# Patient Record
Sex: Male | Born: 1940 | Race: White | Hispanic: No | Marital: Married | Smoking: Never smoker
Health system: Southern US, Community
[De-identification: ages and names within clinical notes are randomized; demographics above are authoritative.]

## PROBLEM LIST (undated history)

## (undated) DIAGNOSIS — Z87442 Personal history of urinary calculi: Secondary | ICD-10-CM

## (undated) DIAGNOSIS — N289 Disorder of kidney and ureter, unspecified: Secondary | ICD-10-CM

## (undated) DIAGNOSIS — H409 Unspecified glaucoma: Secondary | ICD-10-CM

## (undated) DIAGNOSIS — I10 Essential (primary) hypertension: Secondary | ICD-10-CM

## (undated) DIAGNOSIS — E7800 Pure hypercholesterolemia, unspecified: Secondary | ICD-10-CM

## (undated) HISTORY — PX: HERNIA REPAIR: SHX51

---

## 1999-11-22 ENCOUNTER — Encounter: Payer: Self-pay | Admitting: *Deleted

## 1999-11-22 ENCOUNTER — Encounter: Admission: RE | Admit: 1999-11-22 | Discharge: 1999-11-22 | Payer: Self-pay | Admitting: *Deleted

## 2001-08-10 ENCOUNTER — Ambulatory Visit (HOSPITAL_COMMUNITY): Admission: RE | Admit: 2001-08-10 | Discharge: 2001-08-10 | Payer: Self-pay | Admitting: Gastroenterology

## 2001-12-13 ENCOUNTER — Encounter: Admission: RE | Admit: 2001-12-13 | Discharge: 2001-12-13 | Payer: Self-pay | Admitting: Internal Medicine

## 2001-12-13 ENCOUNTER — Encounter: Payer: Self-pay | Admitting: Internal Medicine

## 2002-12-03 ENCOUNTER — Encounter: Admission: RE | Admit: 2002-12-03 | Discharge: 2002-12-03 | Payer: Self-pay | Admitting: Internal Medicine

## 2002-12-03 ENCOUNTER — Encounter: Payer: Self-pay | Admitting: Internal Medicine

## 2005-05-19 ENCOUNTER — Encounter: Admission: RE | Admit: 2005-05-19 | Discharge: 2005-05-19 | Payer: Self-pay | Admitting: Internal Medicine

## 2011-08-29 DIAGNOSIS — E785 Hyperlipidemia, unspecified: Secondary | ICD-10-CM | POA: Diagnosis not present

## 2011-08-29 DIAGNOSIS — Z125 Encounter for screening for malignant neoplasm of prostate: Secondary | ICD-10-CM | POA: Diagnosis not present

## 2011-09-05 DIAGNOSIS — E785 Hyperlipidemia, unspecified: Secondary | ICD-10-CM | POA: Diagnosis not present

## 2011-09-05 DIAGNOSIS — Z125 Encounter for screening for malignant neoplasm of prostate: Secondary | ICD-10-CM | POA: Diagnosis not present

## 2011-09-05 DIAGNOSIS — R413 Other amnesia: Secondary | ICD-10-CM | POA: Diagnosis not present

## 2011-09-05 DIAGNOSIS — Z1211 Encounter for screening for malignant neoplasm of colon: Secondary | ICD-10-CM | POA: Diagnosis not present

## 2011-12-07 DIAGNOSIS — H35379 Puckering of macula, unspecified eye: Secondary | ICD-10-CM | POA: Diagnosis not present

## 2011-12-07 DIAGNOSIS — H52209 Unspecified astigmatism, unspecified eye: Secondary | ICD-10-CM | POA: Diagnosis not present

## 2011-12-07 DIAGNOSIS — H5310 Unspecified subjective visual disturbances: Secondary | ICD-10-CM | POA: Diagnosis not present

## 2011-12-14 DIAGNOSIS — L821 Other seborrheic keratosis: Secondary | ICD-10-CM | POA: Diagnosis not present

## 2011-12-14 DIAGNOSIS — L570 Actinic keratosis: Secondary | ICD-10-CM | POA: Diagnosis not present

## 2011-12-14 DIAGNOSIS — M795 Residual foreign body in soft tissue: Secondary | ICD-10-CM | POA: Diagnosis not present

## 2011-12-14 DIAGNOSIS — D235 Other benign neoplasm of skin of trunk: Secondary | ICD-10-CM | POA: Diagnosis not present

## 2011-12-15 ENCOUNTER — Ambulatory Visit (HOSPITAL_COMMUNITY)
Admission: RE | Admit: 2011-12-15 | Discharge: 2011-12-15 | Disposition: A | Discharge status: Home / Self Care | Payer: Medicare Other | Source: Ambulatory Visit | Attending: Ophthalmology | Admitting: Ophthalmology

## 2011-12-15 DIAGNOSIS — H3560 Retinal hemorrhage, unspecified eye: Secondary | ICD-10-CM | POA: Diagnosis not present

## 2011-12-15 DIAGNOSIS — H5310 Unspecified subjective visual disturbances: Secondary | ICD-10-CM | POA: Diagnosis not present

## 2011-12-15 DIAGNOSIS — H348192 Central retinal vein occlusion, unspecified eye, stable: Secondary | ICD-10-CM | POA: Diagnosis not present

## 2011-12-15 DIAGNOSIS — H349 Unspecified retinal vascular occlusion: Secondary | ICD-10-CM | POA: Diagnosis not present

## 2011-12-15 NOTE — Progress Notes (Signed)
VASCULAR LAB PRELIMINARY  PRELIMINARY  PRELIMINARY  PRELIMINARY  Carotid duplex  completed.    Preliminary report:  Bilateral:  Mild soft plaque in the proximal ICA.  No evidence of hemodynamically significant internal carotid artery stenosis.   Vertebral artery flow is antegrade.      Cabell Lazenby, RVT 12/15/2011, 11:07 AM

## 2012-01-12 DIAGNOSIS — H348192 Central retinal vein occlusion, unspecified eye, stable: Secondary | ICD-10-CM | POA: Diagnosis not present

## 2012-02-14 DIAGNOSIS — H348192 Central retinal vein occlusion, unspecified eye, stable: Secondary | ICD-10-CM | POA: Diagnosis not present

## 2012-04-03 DIAGNOSIS — Z23 Encounter for immunization: Secondary | ICD-10-CM | POA: Diagnosis not present

## 2012-04-12 DIAGNOSIS — H348192 Central retinal vein occlusion, unspecified eye, stable: Secondary | ICD-10-CM | POA: Diagnosis not present

## 2012-04-12 DIAGNOSIS — H35359 Cystoid macular degeneration, unspecified eye: Secondary | ICD-10-CM | POA: Diagnosis not present

## 2012-04-26 DIAGNOSIS — H35359 Cystoid macular degeneration, unspecified eye: Secondary | ICD-10-CM | POA: Diagnosis not present

## 2012-04-26 DIAGNOSIS — H348192 Central retinal vein occlusion, unspecified eye, stable: Secondary | ICD-10-CM | POA: Diagnosis not present

## 2012-05-17 DIAGNOSIS — H348192 Central retinal vein occlusion, unspecified eye, stable: Secondary | ICD-10-CM | POA: Diagnosis not present

## 2012-05-17 DIAGNOSIS — H35359 Cystoid macular degeneration, unspecified eye: Secondary | ICD-10-CM | POA: Diagnosis not present

## 2012-09-04 DIAGNOSIS — Z0000 Encounter for general adult medical examination without abnormal findings: Secondary | ICD-10-CM | POA: Diagnosis not present

## 2012-09-04 DIAGNOSIS — E785 Hyperlipidemia, unspecified: Secondary | ICD-10-CM | POA: Diagnosis not present

## 2012-09-04 DIAGNOSIS — Z125 Encounter for screening for malignant neoplasm of prostate: Secondary | ICD-10-CM | POA: Diagnosis not present

## 2012-09-06 DIAGNOSIS — H35359 Cystoid macular degeneration, unspecified eye: Secondary | ICD-10-CM | POA: Diagnosis not present

## 2012-09-06 DIAGNOSIS — H348192 Central retinal vein occlusion, unspecified eye, stable: Secondary | ICD-10-CM | POA: Diagnosis not present

## 2012-09-11 DIAGNOSIS — E785 Hyperlipidemia, unspecified: Secondary | ICD-10-CM | POA: Diagnosis not present

## 2012-09-11 DIAGNOSIS — Z125 Encounter for screening for malignant neoplasm of prostate: Secondary | ICD-10-CM | POA: Diagnosis not present

## 2012-09-11 DIAGNOSIS — R413 Other amnesia: Secondary | ICD-10-CM | POA: Diagnosis not present

## 2012-10-23 DIAGNOSIS — H348192 Central retinal vein occlusion, unspecified eye, stable: Secondary | ICD-10-CM | POA: Diagnosis not present

## 2012-10-23 DIAGNOSIS — H35359 Cystoid macular degeneration, unspecified eye: Secondary | ICD-10-CM | POA: Diagnosis not present

## 2012-12-25 DIAGNOSIS — H348192 Central retinal vein occlusion, unspecified eye, stable: Secondary | ICD-10-CM | POA: Diagnosis not present

## 2012-12-25 DIAGNOSIS — H35359 Cystoid macular degeneration, unspecified eye: Secondary | ICD-10-CM | POA: Diagnosis not present

## 2013-02-05 DIAGNOSIS — H348192 Central retinal vein occlusion, unspecified eye, stable: Secondary | ICD-10-CM | POA: Diagnosis not present

## 2013-02-05 DIAGNOSIS — H35359 Cystoid macular degeneration, unspecified eye: Secondary | ICD-10-CM | POA: Diagnosis not present

## 2013-02-08 DIAGNOSIS — Z23 Encounter for immunization: Secondary | ICD-10-CM | POA: Diagnosis not present

## 2013-02-18 DIAGNOSIS — B029 Zoster without complications: Secondary | ICD-10-CM | POA: Diagnosis not present

## 2013-03-05 DIAGNOSIS — H348192 Central retinal vein occlusion, unspecified eye, stable: Secondary | ICD-10-CM | POA: Diagnosis not present

## 2013-03-05 DIAGNOSIS — H35359 Cystoid macular degeneration, unspecified eye: Secondary | ICD-10-CM | POA: Diagnosis not present

## 2013-04-09 DIAGNOSIS — H348192 Central retinal vein occlusion, unspecified eye, stable: Secondary | ICD-10-CM | POA: Diagnosis not present

## 2013-04-09 DIAGNOSIS — H35359 Cystoid macular degeneration, unspecified eye: Secondary | ICD-10-CM | POA: Diagnosis not present

## 2013-04-12 DIAGNOSIS — M1710 Unilateral primary osteoarthritis, unspecified knee: Secondary | ICD-10-CM | POA: Diagnosis not present

## 2013-05-01 DIAGNOSIS — H35379 Puckering of macula, unspecified eye: Secondary | ICD-10-CM | POA: Diagnosis not present

## 2013-05-01 DIAGNOSIS — H52209 Unspecified astigmatism, unspecified eye: Secondary | ICD-10-CM | POA: Diagnosis not present

## 2013-05-01 DIAGNOSIS — H348192 Central retinal vein occlusion, unspecified eye, stable: Secondary | ICD-10-CM | POA: Diagnosis not present

## 2013-05-01 DIAGNOSIS — H2510 Age-related nuclear cataract, unspecified eye: Secondary | ICD-10-CM | POA: Diagnosis not present

## 2013-05-21 DIAGNOSIS — H35359 Cystoid macular degeneration, unspecified eye: Secondary | ICD-10-CM | POA: Diagnosis not present

## 2013-05-21 DIAGNOSIS — H348192 Central retinal vein occlusion, unspecified eye, stable: Secondary | ICD-10-CM | POA: Diagnosis not present

## 2013-06-03 DIAGNOSIS — H47099 Other disorders of optic nerve, not elsewhere classified, unspecified eye: Secondary | ICD-10-CM | POA: Diagnosis not present

## 2013-06-04 DIAGNOSIS — H35359 Cystoid macular degeneration, unspecified eye: Secondary | ICD-10-CM | POA: Diagnosis not present

## 2013-06-04 DIAGNOSIS — H348192 Central retinal vein occlusion, unspecified eye, stable: Secondary | ICD-10-CM | POA: Diagnosis not present

## 2013-07-16 DIAGNOSIS — H35359 Cystoid macular degeneration, unspecified eye: Secondary | ICD-10-CM | POA: Diagnosis not present

## 2013-07-16 DIAGNOSIS — H348192 Central retinal vein occlusion, unspecified eye, stable: Secondary | ICD-10-CM | POA: Diagnosis not present

## 2013-08-29 DIAGNOSIS — H348192 Central retinal vein occlusion, unspecified eye, stable: Secondary | ICD-10-CM | POA: Diagnosis not present

## 2013-08-29 DIAGNOSIS — H35359 Cystoid macular degeneration, unspecified eye: Secondary | ICD-10-CM | POA: Diagnosis not present

## 2013-09-11 DIAGNOSIS — Z125 Encounter for screening for malignant neoplasm of prostate: Secondary | ICD-10-CM | POA: Diagnosis not present

## 2013-09-11 DIAGNOSIS — Z1331 Encounter for screening for depression: Secondary | ICD-10-CM | POA: Diagnosis not present

## 2013-09-11 DIAGNOSIS — Z0000 Encounter for general adult medical examination without abnormal findings: Secondary | ICD-10-CM | POA: Diagnosis not present

## 2013-09-11 DIAGNOSIS — E785 Hyperlipidemia, unspecified: Secondary | ICD-10-CM | POA: Diagnosis not present

## 2013-09-18 DIAGNOSIS — N400 Enlarged prostate without lower urinary tract symptoms: Secondary | ICD-10-CM | POA: Diagnosis not present

## 2013-09-18 DIAGNOSIS — Z125 Encounter for screening for malignant neoplasm of prostate: Secondary | ICD-10-CM | POA: Diagnosis not present

## 2013-09-18 DIAGNOSIS — N182 Chronic kidney disease, stage 2 (mild): Secondary | ICD-10-CM | POA: Diagnosis not present

## 2013-09-18 DIAGNOSIS — E785 Hyperlipidemia, unspecified: Secondary | ICD-10-CM | POA: Diagnosis not present

## 2013-10-01 DIAGNOSIS — H348192 Central retinal vein occlusion, unspecified eye, stable: Secondary | ICD-10-CM | POA: Diagnosis not present

## 2013-11-12 DIAGNOSIS — H348192 Central retinal vein occlusion, unspecified eye, stable: Secondary | ICD-10-CM | POA: Diagnosis not present

## 2014-01-02 DIAGNOSIS — H348192 Central retinal vein occlusion, unspecified eye, stable: Secondary | ICD-10-CM | POA: Diagnosis not present

## 2014-02-18 DIAGNOSIS — H348192 Central retinal vein occlusion, unspecified eye, stable: Secondary | ICD-10-CM | POA: Diagnosis not present

## 2014-02-18 DIAGNOSIS — H35359 Cystoid macular degeneration, unspecified eye: Secondary | ICD-10-CM | POA: Diagnosis not present

## 2014-02-28 DIAGNOSIS — Z23 Encounter for immunization: Secondary | ICD-10-CM | POA: Diagnosis not present

## 2014-04-15 DIAGNOSIS — H34812 Central retinal vein occlusion, left eye: Secondary | ICD-10-CM | POA: Diagnosis not present

## 2014-04-15 DIAGNOSIS — H35352 Cystoid macular degeneration, left eye: Secondary | ICD-10-CM | POA: Diagnosis not present

## 2014-06-11 DIAGNOSIS — H35372 Puckering of macula, left eye: Secondary | ICD-10-CM | POA: Diagnosis not present

## 2014-06-11 DIAGNOSIS — H5213 Myopia, bilateral: Secondary | ICD-10-CM | POA: Diagnosis not present

## 2014-06-12 DIAGNOSIS — H34812 Central retinal vein occlusion, left eye: Secondary | ICD-10-CM | POA: Diagnosis not present

## 2014-06-12 DIAGNOSIS — H35352 Cystoid macular degeneration, left eye: Secondary | ICD-10-CM | POA: Diagnosis not present

## 2014-07-24 DIAGNOSIS — H35352 Cystoid macular degeneration, left eye: Secondary | ICD-10-CM | POA: Diagnosis not present

## 2014-07-24 DIAGNOSIS — H34812 Central retinal vein occlusion, left eye: Secondary | ICD-10-CM | POA: Diagnosis not present

## 2014-09-17 DIAGNOSIS — E785 Hyperlipidemia, unspecified: Secondary | ICD-10-CM | POA: Diagnosis not present

## 2014-09-17 DIAGNOSIS — Z0000 Encounter for general adult medical examination without abnormal findings: Secondary | ICD-10-CM | POA: Diagnosis not present

## 2014-09-17 DIAGNOSIS — M8580 Other specified disorders of bone density and structure, unspecified site: Secondary | ICD-10-CM | POA: Diagnosis not present

## 2014-09-17 DIAGNOSIS — Z125 Encounter for screening for malignant neoplasm of prostate: Secondary | ICD-10-CM | POA: Diagnosis not present

## 2014-09-17 DIAGNOSIS — Z1389 Encounter for screening for other disorder: Secondary | ICD-10-CM | POA: Diagnosis not present

## 2014-09-18 DIAGNOSIS — H34812 Central retinal vein occlusion, left eye: Secondary | ICD-10-CM | POA: Diagnosis not present

## 2014-09-24 DIAGNOSIS — E785 Hyperlipidemia, unspecified: Secondary | ICD-10-CM | POA: Diagnosis not present

## 2014-09-24 DIAGNOSIS — N400 Enlarged prostate without lower urinary tract symptoms: Secondary | ICD-10-CM | POA: Diagnosis not present

## 2014-09-24 DIAGNOSIS — N182 Chronic kidney disease, stage 2 (mild): Secondary | ICD-10-CM | POA: Diagnosis not present

## 2014-09-24 DIAGNOSIS — M8580 Other specified disorders of bone density and structure, unspecified site: Secondary | ICD-10-CM | POA: Diagnosis not present

## 2014-11-13 DIAGNOSIS — H35373 Puckering of macula, bilateral: Secondary | ICD-10-CM | POA: Diagnosis not present

## 2014-11-13 DIAGNOSIS — H34812 Central retinal vein occlusion, left eye: Secondary | ICD-10-CM | POA: Diagnosis not present

## 2014-11-13 DIAGNOSIS — H43813 Vitreous degeneration, bilateral: Secondary | ICD-10-CM | POA: Diagnosis not present

## 2015-01-13 DIAGNOSIS — H34812 Central retinal vein occlusion, left eye: Secondary | ICD-10-CM | POA: Diagnosis not present

## 2015-02-26 DIAGNOSIS — Z23 Encounter for immunization: Secondary | ICD-10-CM | POA: Diagnosis not present

## 2015-03-31 DIAGNOSIS — H348122 Central retinal vein occlusion, left eye, stable: Secondary | ICD-10-CM | POA: Diagnosis not present

## 2015-06-15 DIAGNOSIS — H2513 Age-related nuclear cataract, bilateral: Secondary | ICD-10-CM | POA: Diagnosis not present

## 2015-06-15 DIAGNOSIS — H5213 Myopia, bilateral: Secondary | ICD-10-CM | POA: Diagnosis not present

## 2015-06-23 DIAGNOSIS — H43813 Vitreous degeneration, bilateral: Secondary | ICD-10-CM | POA: Diagnosis not present

## 2015-06-23 DIAGNOSIS — H348122 Central retinal vein occlusion, left eye, stable: Secondary | ICD-10-CM | POA: Diagnosis not present

## 2015-06-23 DIAGNOSIS — H35373 Puckering of macula, bilateral: Secondary | ICD-10-CM | POA: Diagnosis not present

## 2015-06-23 DIAGNOSIS — H35341 Macular cyst, hole, or pseudohole, right eye: Secondary | ICD-10-CM | POA: Diagnosis not present

## 2015-09-22 DIAGNOSIS — Z0000 Encounter for general adult medical examination without abnormal findings: Secondary | ICD-10-CM | POA: Diagnosis not present

## 2015-09-22 DIAGNOSIS — Z23 Encounter for immunization: Secondary | ICD-10-CM | POA: Diagnosis not present

## 2015-09-22 DIAGNOSIS — E559 Vitamin D deficiency, unspecified: Secondary | ICD-10-CM | POA: Diagnosis not present

## 2015-09-22 DIAGNOSIS — Z1389 Encounter for screening for other disorder: Secondary | ICD-10-CM | POA: Diagnosis not present

## 2015-09-22 DIAGNOSIS — E785 Hyperlipidemia, unspecified: Secondary | ICD-10-CM | POA: Diagnosis not present

## 2015-09-22 DIAGNOSIS — Z125 Encounter for screening for malignant neoplasm of prostate: Secondary | ICD-10-CM | POA: Diagnosis not present

## 2015-09-22 DIAGNOSIS — M8580 Other specified disorders of bone density and structure, unspecified site: Secondary | ICD-10-CM | POA: Diagnosis not present

## 2015-09-29 DIAGNOSIS — N182 Chronic kidney disease, stage 2 (mild): Secondary | ICD-10-CM | POA: Diagnosis not present

## 2015-09-29 DIAGNOSIS — N400 Enlarged prostate without lower urinary tract symptoms: Secondary | ICD-10-CM | POA: Diagnosis not present

## 2015-09-29 DIAGNOSIS — E785 Hyperlipidemia, unspecified: Secondary | ICD-10-CM | POA: Diagnosis not present

## 2015-09-29 DIAGNOSIS — M8580 Other specified disorders of bone density and structure, unspecified site: Secondary | ICD-10-CM | POA: Diagnosis not present

## 2015-10-01 DIAGNOSIS — H34812 Central retinal vein occlusion, left eye, with macular edema: Secondary | ICD-10-CM | POA: Diagnosis not present

## 2015-10-01 DIAGNOSIS — H35372 Puckering of macula, left eye: Secondary | ICD-10-CM | POA: Diagnosis not present

## 2016-01-12 DIAGNOSIS — H348122 Central retinal vein occlusion, left eye, stable: Secondary | ICD-10-CM | POA: Diagnosis not present

## 2016-03-04 DIAGNOSIS — Z23 Encounter for immunization: Secondary | ICD-10-CM | POA: Diagnosis not present

## 2016-04-19 DIAGNOSIS — H348122 Central retinal vein occlusion, left eye, stable: Secondary | ICD-10-CM | POA: Diagnosis not present

## 2016-04-19 DIAGNOSIS — H35373 Puckering of macula, bilateral: Secondary | ICD-10-CM | POA: Diagnosis not present

## 2016-06-21 DIAGNOSIS — H2513 Age-related nuclear cataract, bilateral: Secondary | ICD-10-CM | POA: Diagnosis not present

## 2016-06-21 DIAGNOSIS — H5213 Myopia, bilateral: Secondary | ICD-10-CM | POA: Diagnosis not present

## 2016-06-21 DIAGNOSIS — H35373 Puckering of macula, bilateral: Secondary | ICD-10-CM | POA: Diagnosis not present

## 2016-08-17 DIAGNOSIS — H348122 Central retinal vein occlusion, left eye, stable: Secondary | ICD-10-CM | POA: Diagnosis not present

## 2016-08-17 DIAGNOSIS — H35373 Puckering of macula, bilateral: Secondary | ICD-10-CM | POA: Diagnosis not present

## 2016-09-12 DIAGNOSIS — H0012 Chalazion right lower eyelid: Secondary | ICD-10-CM | POA: Diagnosis not present

## 2016-09-22 DIAGNOSIS — Z0000 Encounter for general adult medical examination without abnormal findings: Secondary | ICD-10-CM | POA: Diagnosis not present

## 2016-09-22 DIAGNOSIS — E785 Hyperlipidemia, unspecified: Secondary | ICD-10-CM | POA: Diagnosis not present

## 2016-09-22 DIAGNOSIS — Z125 Encounter for screening for malignant neoplasm of prostate: Secondary | ICD-10-CM | POA: Diagnosis not present

## 2016-09-22 DIAGNOSIS — M8580 Other specified disorders of bone density and structure, unspecified site: Secondary | ICD-10-CM | POA: Diagnosis not present

## 2016-09-29 DIAGNOSIS — N400 Enlarged prostate without lower urinary tract symptoms: Secondary | ICD-10-CM | POA: Diagnosis not present

## 2016-09-29 DIAGNOSIS — M8580 Other specified disorders of bone density and structure, unspecified site: Secondary | ICD-10-CM | POA: Diagnosis not present

## 2016-09-29 DIAGNOSIS — H6123 Impacted cerumen, bilateral: Secondary | ICD-10-CM | POA: Diagnosis not present

## 2016-12-22 DIAGNOSIS — H348122 Central retinal vein occlusion, left eye, stable: Secondary | ICD-10-CM | POA: Diagnosis not present

## 2016-12-22 DIAGNOSIS — H35373 Puckering of macula, bilateral: Secondary | ICD-10-CM | POA: Diagnosis not present

## 2017-03-23 ENCOUNTER — Emergency Department (HOSPITAL_COMMUNITY): Payer: Medicare Other

## 2017-03-23 ENCOUNTER — Encounter (HOSPITAL_COMMUNITY): Payer: Self-pay | Admitting: Obstetrics and Gynecology

## 2017-03-23 ENCOUNTER — Emergency Department (HOSPITAL_COMMUNITY)
Admission: EM | Admit: 2017-03-23 | Discharge: 2017-03-23 | Disposition: A | Discharge status: Home / Self Care | Payer: Medicare Other | Attending: Emergency Medicine | Admitting: Emergency Medicine

## 2017-03-23 DIAGNOSIS — W11XXXA Fall on and from ladder, initial encounter: Secondary | ICD-10-CM | POA: Insufficient documentation

## 2017-03-23 DIAGNOSIS — Y939 Activity, unspecified: Secondary | ICD-10-CM | POA: Insufficient documentation

## 2017-03-23 DIAGNOSIS — S93401A Sprain of unspecified ligament of right ankle, initial encounter: Secondary | ICD-10-CM | POA: Insufficient documentation

## 2017-03-23 DIAGNOSIS — Y92009 Unspecified place in unspecified non-institutional (private) residence as the place of occurrence of the external cause: Secondary | ICD-10-CM | POA: Diagnosis not present

## 2017-03-23 DIAGNOSIS — Y998 Other external cause status: Secondary | ICD-10-CM | POA: Diagnosis not present

## 2017-03-23 DIAGNOSIS — S99911A Unspecified injury of right ankle, initial encounter: Secondary | ICD-10-CM | POA: Diagnosis present

## 2017-03-23 DIAGNOSIS — M25571 Pain in right ankle and joints of right foot: Secondary | ICD-10-CM | POA: Diagnosis not present

## 2017-03-23 NOTE — Discharge Instructions (Signed)
Keep leg elevated as much as possible.  Ice 2-3 times per day.  Follow up with your doctor in one week for a recheck.

## 2017-03-23 NOTE — ED Triage Notes (Signed)
Pt was cleaning the gutters and fell off a ladder landing on his right ankle

## 2017-03-23 NOTE — ED Provider Notes (Signed)
Pt awaiting x-ray.  Ankle x-ray shows no acute injury, small chip off lateral malleolus.  Pt has mild swelling to both malleoli, advised pt this could be acute vs old, likely from sprain.  Placed in ASO.  Denies need for pain meds.  Advised to f/u with PCP in one week for recheck.  Advised in ice and elevation.   Jordan Johns, MD 03/23/17 [phone]

## 2017-03-23 NOTE — ED Provider Notes (Signed)
[facility] DEPT Provider Note   CSN: [id number] Arrival date & time: 03/23/17  1340     History   Chief Complaint Chief Complaint  Patient presents with  . Ankle Pain    HPI Jordan Lang is a 76 y.o. male.  76 year old male presents with right ankle pain/injury. He reports that he fell off a ladder while repairing his gutters. He landed hard on the right ankle. No other reported injury. He declines medication for pain at this time. He is able to ambulate (albeit with a limp). He showered and changed prior to coming to the ED for evaluation.    The history is provided by the patient.  Ankle Pain   The incident occurred 3 to 5 hours ago. The incident occurred at home. The injury mechanism was a fall. The pain is present in the right ankle. The quality of the pain is described as aching. The pain is mild. The pain has been constant since onset. Pertinent negatives include no inability to bear weight. He reports no foreign bodies present. The symptoms are aggravated by bearing weight.    No past medical history on file.  There are no active problems to display for this patient.   No past surgical history on file.     Home Medications    Prior to Admission medications   Not on File    Family History No family history on file.  Social History Social History  Substance Use Topics  . Smoking status: Not on file  . Smokeless tobacco: Not on file  . Alcohol use Yes     Comment: Social     Allergies   Patient has no allergy information on record.   Review of Systems Review of Systems  Musculoskeletal:       Right ankle pain  All other systems reviewed and are negative.    Physical Exam Updated Vital Signs BP (!) 145/78 (BP Location: Right Arm)   Pulse 62   Temp 97.7 F (36.5 C) (Oral)   Resp 16   Ht 5\' 8"  (1.727 m)   Wt 63.5 kg (140 lb)   SpO2 98%   BMI 21.29 kg/m   Physical Exam  Constitutional: He appears  well-developed and well-nourished.  HENT:  Head: Normocephalic and atraumatic.  Mouth/Throat: Oropharynx is clear and moist.  Eyes: Pupils are equal, round, and reactive to light. Conjunctivae and EOM are normal.  Neck: Normal range of motion. Neck supple.  Cardiovascular: Normal rate and regular rhythm.   No murmur heard. Pulmonary/Chest: Effort normal and breath sounds normal. No respiratory distress.  Abdominal: Soft. He exhibits no distension. There is no tenderness.  Musculoskeletal: Normal range of motion. He exhibits no edema.  Mild edema of right ankle - tender to medial and lateral malleolus - Full AROM - Ambulatory and weight bearing - No break in skin - Distal RLE is NVI  Neurological: He is alert.  Skin: Skin is warm and dry.  Psychiatric: He has a normal mood and affect.  Nursing note and vitals reviewed.    ED Treatments / Results  Labs (all labs ordered are listed, but only abnormal results are displayed) Labs Reviewed - No data to display  EKG  EKG Interpretation None       Radiology No results found.  Procedures Procedures (including critical care time)  Medications Ordered in ED Medications - No data to display   Initial Impression / Assessment and Plan / ED  Course  I have reviewed the triage vital signs and the nursing notes.  Pertinent labs & imaging results that were available during my care of the patient were reviewed by me and considered in my medical decision making (see chart for details).     MSE - Screen complete  Right ankle injury - Fx vs Sprain. Will obtain Xray and manage appropriately.   1548 - XR and Dispo pending - Dr. Tamera Punt aware of case.   Final Clinical Impressions(s) / ED Diagnoses   Final diagnoses:  Injury of right ankle, initial encounter    New Prescriptions New Prescriptions   No medications on file     Valarie Merino, MD 03/23/17 1549

## 2017-03-28 DIAGNOSIS — S93401D Sprain of unspecified ligament of right ankle, subsequent encounter: Secondary | ICD-10-CM | POA: Diagnosis not present

## 2017-03-28 DIAGNOSIS — Z23 Encounter for immunization: Secondary | ICD-10-CM | POA: Diagnosis not present

## 2017-05-18 DIAGNOSIS — H43813 Vitreous degeneration, bilateral: Secondary | ICD-10-CM | POA: Diagnosis not present

## 2017-05-18 DIAGNOSIS — H348122 Central retinal vein occlusion, left eye, stable: Secondary | ICD-10-CM | POA: Diagnosis not present

## 2017-05-18 DIAGNOSIS — H35373 Puckering of macula, bilateral: Secondary | ICD-10-CM | POA: Diagnosis not present

## 2017-09-28 DIAGNOSIS — M8580 Other specified disorders of bone density and structure, unspecified site: Secondary | ICD-10-CM | POA: Diagnosis not present

## 2017-09-28 DIAGNOSIS — E785 Hyperlipidemia, unspecified: Secondary | ICD-10-CM | POA: Diagnosis not present

## 2017-09-28 DIAGNOSIS — E559 Vitamin D deficiency, unspecified: Secondary | ICD-10-CM | POA: Diagnosis not present

## 2017-09-28 DIAGNOSIS — N182 Chronic kidney disease, stage 2 (mild): Secondary | ICD-10-CM | POA: Diagnosis not present

## 2017-10-05 DIAGNOSIS — E785 Hyperlipidemia, unspecified: Secondary | ICD-10-CM | POA: Diagnosis not present

## 2017-10-05 DIAGNOSIS — Z0000 Encounter for general adult medical examination without abnormal findings: Secondary | ICD-10-CM | POA: Diagnosis not present

## 2017-10-05 DIAGNOSIS — H6120 Impacted cerumen, unspecified ear: Secondary | ICD-10-CM | POA: Diagnosis not present

## 2017-11-09 DIAGNOSIS — H43813 Vitreous degeneration, bilateral: Secondary | ICD-10-CM | POA: Diagnosis not present

## 2017-11-09 DIAGNOSIS — H348122 Central retinal vein occlusion, left eye, stable: Secondary | ICD-10-CM | POA: Diagnosis not present

## 2017-11-09 DIAGNOSIS — H35373 Puckering of macula, bilateral: Secondary | ICD-10-CM | POA: Diagnosis not present

## 2018-01-29 DIAGNOSIS — H1789 Other corneal scars and opacities: Secondary | ICD-10-CM | POA: Diagnosis not present

## 2018-01-29 DIAGNOSIS — H2513 Age-related nuclear cataract, bilateral: Secondary | ICD-10-CM | POA: Diagnosis not present

## 2018-01-29 DIAGNOSIS — H5213 Myopia, bilateral: Secondary | ICD-10-CM | POA: Diagnosis not present

## 2018-01-29 DIAGNOSIS — H35373 Puckering of macula, bilateral: Secondary | ICD-10-CM | POA: Diagnosis not present

## 2018-02-09 DIAGNOSIS — Z23 Encounter for immunization: Secondary | ICD-10-CM | POA: Diagnosis not present

## 2018-04-18 DIAGNOSIS — H35373 Puckering of macula, bilateral: Secondary | ICD-10-CM | POA: Diagnosis not present

## 2018-04-18 DIAGNOSIS — H348122 Central retinal vein occlusion, left eye, stable: Secondary | ICD-10-CM | POA: Diagnosis not present

## 2018-06-22 DIAGNOSIS — M25521 Pain in right elbow: Secondary | ICD-10-CM | POA: Diagnosis not present

## 2018-07-17 DIAGNOSIS — M25521 Pain in right elbow: Secondary | ICD-10-CM | POA: Diagnosis not present

## 2018-10-04 DIAGNOSIS — E785 Hyperlipidemia, unspecified: Secondary | ICD-10-CM | POA: Diagnosis not present

## 2018-10-04 DIAGNOSIS — Z125 Encounter for screening for malignant neoplasm of prostate: Secondary | ICD-10-CM | POA: Diagnosis not present

## 2018-10-08 DIAGNOSIS — Z0000 Encounter for general adult medical examination without abnormal findings: Secondary | ICD-10-CM | POA: Diagnosis not present

## 2018-10-08 DIAGNOSIS — N400 Enlarged prostate without lower urinary tract symptoms: Secondary | ICD-10-CM | POA: Diagnosis not present

## 2018-10-08 DIAGNOSIS — Z1212 Encounter for screening for malignant neoplasm of rectum: Secondary | ICD-10-CM | POA: Diagnosis not present

## 2018-10-08 DIAGNOSIS — E785 Hyperlipidemia, unspecified: Secondary | ICD-10-CM | POA: Diagnosis not present

## 2018-10-08 DIAGNOSIS — D72819 Decreased white blood cell count, unspecified: Secondary | ICD-10-CM | POA: Diagnosis not present

## 2018-10-08 DIAGNOSIS — Q676 Pectus excavatum: Secondary | ICD-10-CM | POA: Diagnosis not present

## 2018-10-08 DIAGNOSIS — M8580 Other specified disorders of bone density and structure, unspecified site: Secondary | ICD-10-CM | POA: Diagnosis not present

## 2018-10-08 DIAGNOSIS — R0989 Other specified symptoms and signs involving the circulatory and respiratory systems: Secondary | ICD-10-CM | POA: Diagnosis not present

## 2018-10-28 ENCOUNTER — Emergency Department (HOSPITAL_COMMUNITY): Payer: Medicare Other

## 2018-10-28 ENCOUNTER — Emergency Department (HOSPITAL_COMMUNITY)
Admission: EM | Admit: 2018-10-28 | Discharge: 2018-10-29 | Disposition: A | Discharge status: Home / Self Care | Payer: Medicare Other | Attending: Emergency Medicine | Admitting: Emergency Medicine

## 2018-10-28 ENCOUNTER — Other Ambulatory Visit: Payer: Self-pay

## 2018-10-28 ENCOUNTER — Encounter (HOSPITAL_COMMUNITY): Payer: Self-pay

## 2018-10-28 DIAGNOSIS — N132 Hydronephrosis with renal and ureteral calculous obstruction: Secondary | ICD-10-CM | POA: Diagnosis not present

## 2018-10-28 DIAGNOSIS — Z79899 Other long term (current) drug therapy: Secondary | ICD-10-CM | POA: Diagnosis not present

## 2018-10-28 DIAGNOSIS — N200 Calculus of kidney: Secondary | ICD-10-CM | POA: Insufficient documentation

## 2018-10-28 DIAGNOSIS — N201 Calculus of ureter: Secondary | ICD-10-CM

## 2018-10-28 DIAGNOSIS — R110 Nausea: Secondary | ICD-10-CM | POA: Diagnosis not present

## 2018-10-28 DIAGNOSIS — R109 Unspecified abdominal pain: Secondary | ICD-10-CM | POA: Diagnosis not present

## 2018-10-28 HISTORY — DX: Disorder of kidney and ureter, unspecified: N28.9

## 2018-10-28 LAB — CBC
HCT: 40.4 % (ref 39.0–52.0)
Hemoglobin: 13.5 g/dL (ref 13.0–17.0)
MCH: 32.1 pg (ref 26.0–34.0)
MCHC: 33.4 g/dL (ref 30.0–36.0)
MCV: 96.2 fL (ref 80.0–100.0)
Platelets: 198 10*3/uL (ref 150–400)
RBC: 4.2 MIL/uL — ABNORMAL LOW (ref 4.22–5.81)
RDW: 12 % (ref 11.5–15.5)
WBC: 7.1 10*3/uL (ref 4.0–10.5)
nRBC: 0 % (ref 0.0–0.2)

## 2018-10-28 LAB — URINALYSIS, ROUTINE W REFLEX MICROSCOPIC
Bilirubin Urine: NEGATIVE
Glucose, UA: NEGATIVE mg/dL
Ketones, ur: 5 mg/dL — AB
Leukocytes,Ua: NEGATIVE
Nitrite: NEGATIVE
Protein, ur: 100 mg/dL — AB
RBC / HPF: >50 RBC/hpf — ABNORMAL HIGH (ref 0–5)
Specific Gravity, Urine: 1.024 (ref 1.005–1.030)
pH: 6 (ref 5.0–8.0)

## 2018-10-28 LAB — BASIC METABOLIC PANEL
Anion gap: 11 (ref 5–15)
BUN: 22 mg/dL (ref 8–23)
CO2: 22 mmol/L (ref 22–32)
Calcium: 9.6 mg/dL (ref 8.9–10.3)
Chloride: 105 mmol/L (ref 98–111)
Creatinine, Ser: 0.87 mg/dL (ref 0.61–1.24)
GFR calc Af Amer: >60 mL/min (ref >60)
GFR calc non Af Amer: >60 mL/min (ref >60)
Glucose, Bld: 129 mg/dL — ABNORMAL HIGH (ref 70–99)
Potassium: 4.4 mmol/L (ref 3.5–5.1)
Sodium: 138 mmol/L (ref 135–145)

## 2018-10-28 MED ORDER — SODIUM CHLORIDE 0.9 % IV BOLUS
1000.0000 mL | Freq: Once | INTRAVENOUS | Status: AC
Start: 2018-10-28 — End: 2018-10-29
  Administered 2018-10-28: 21:00:00 1000 mL via INTRAVENOUS

## 2018-10-28 MED ORDER — MORPHINE SULFATE (PF) 4 MG/ML IV SOLN
4.0000 mg | Freq: Once | INTRAVENOUS | Status: AC
  Administered 2018-10-28: 4 mg via INTRAVENOUS
  Filled 2018-10-28: qty 1

## 2018-10-28 MED ORDER — TAMSULOSIN HCL 0.4 MG PO CAPS: 0.4000 mg | ORAL_CAPSULE | Freq: Every day | ORAL | 0 refills | Status: DC

## 2018-10-28 MED ORDER — KETOROLAC TROMETHAMINE 30 MG/ML IJ SOLN
30.0000 mg | Freq: Once | INTRAMUSCULAR | Status: AC
  Administered 2018-10-28: 30 mg via INTRAVENOUS
  Filled 2018-10-28: qty 1

## 2018-10-28 MED ORDER — HYDROMORPHONE HCL 1 MG/ML IJ SOLN
0.5000 mg | Freq: Once | INTRAMUSCULAR | Status: AC
  Administered 2018-10-28: 0.5 mg via INTRAVENOUS
  Filled 2018-10-28: qty 1

## 2018-10-28 MED ORDER — OXYCODONE-ACETAMINOPHEN 5-325 MG PO TABS: 1.0000 | ORAL_TABLET | Freq: Four times a day (QID) | ORAL | 0 refills | Status: DC | PRN

## 2018-10-28 NOTE — Discharge Instructions (Signed)
Follow-up with the urologist provided.  Return here for any worsening in your condition.  Increase your fluid intake.

## 2018-10-28 NOTE — ED Triage Notes (Signed)
Pt reports left sided flank pain that started today, hx of kidney stones about 13 years ago, states it feels similar.  Little nausea. Pt a.o, nad noted

## 2018-10-28 NOTE — ED Provider Notes (Signed)
[facility] EMERGENCY DEPARTMENT Provider Note   CSN: [id number] Arrival date & time: 10/28/18  9509    History   Chief Complaint Chief Complaint  Patient presents with   Flank Pain    HPI Jordan Lang is a 78 y.o. male.     HPI Patient presents to the emergency department with left-sided flank pain that started earlier this afternoon.  The patient states that he did have one episode of nausea with this.  He states the pain is fairly constant and is barely in the left flank radiating to the left anterior abdomen.  Patient states he has had a kidney stone in the past and this feels similar.  Patient states he did not take any medications prior to arrival for his symptoms.  The patient denies chest pain, shortness of breath, headache,blurred vision, neck pain, fever, cough, weakness, numbness, dizziness, anorexia, edema, , vomiting, diarrhea, rash, back pain, dysuria, hematemesis, bloody stool, near syncope, or syncope. Past Medical History:  Diagnosis Date   Renal disorder    kidney stones    There are no active problems to display for this patient.   Past Surgical History:  Procedure Laterality Date   HERNIA REPAIR          Home Medications    Prior to Admission medications   Medication Sig Start Date End Date Taking? Authorizing Provider  Cholecalciferol (VITAMIN D3) 2000 units TABS Take 1 tablet by mouth daily.    [provider]  Multiple Vitamins-Minerals (CENTRUM ADULTS PO) Take 1 tablet by mouth.    [provider]    Family History No family history on file.  Social History Social History   Tobacco Use   Smoking status: Not on file  Substance Use Topics   Alcohol use: Yes    Comment: Social   Drug use: Not on file     Allergies   Patient has no allergy information on record.   Review of Systems Review of Systems All other systems negative except as documented in the HPI. All pertinent positives and  negatives as reviewed in the HPI.  Physical Exam Updated Vital Signs BP (!) 163/68    Pulse (!) 47    Temp 98.2 F (36.8 C) (Oral)    Resp 20    Ht 5\' 8"  (1.727 m)    Wt 61.2 kg    SpO2 95%    BMI 20.53 kg/m   Physical Exam Vitals signs and nursing note reviewed.  Constitutional:      General: He is not in acute distress.    Appearance: He is well-developed.  HENT:     Head: Normocephalic and atraumatic.  Eyes:     Pupils: Pupils are equal, round, and reactive to light.  Neck:     Musculoskeletal: Normal range of motion and neck supple.  Cardiovascular:     Rate and Rhythm: Normal rate and regular rhythm.     Heart sounds: Normal heart sounds. No murmur. No friction rub. No gallop.   Pulmonary:     Effort: Pulmonary effort is normal. No respiratory distress.     Breath sounds: Normal breath sounds. No wheezing.  Abdominal:     General: Bowel sounds are normal. There is no distension.     Palpations: Abdomen is soft.     Tenderness: There is no abdominal tenderness.  Skin:    General: Skin is warm and dry.     Capillary Refill: Capillary refill takes less than  2 seconds.     Findings: No erythema or rash.  Neurological:     Mental Status: He is alert and oriented to person, place, and time.     Motor: No abnormal muscle tone.     Coordination: Coordination normal.  Psychiatric:        Behavior: Behavior normal.      ED Treatments / Results  Labs (all labs ordered are listed, but only abnormal results are displayed) Labs Reviewed  CBC - Abnormal; Notable for the following components:      Result Value   RBC 4.20 (*)    All other components within normal limits  BASIC METABOLIC PANEL - Abnormal; Notable for the following components:   Glucose, Bld 129 (*)    All other components within normal limits  URINALYSIS, ROUTINE W REFLEX MICROSCOPIC    EKG None  Radiology Ct Renal Stone Study  Result Date: 10/28/2018 CLINICAL DATA:  78 year old male with history of  left-sided flank pain and nausea. History of kidney stones. EXAM: CT ABDOMEN AND PELVIS WITHOUT CONTRAST TECHNIQUE: Multidetector CT imaging of the abdomen and pelvis was performed following the standard protocol without IV contrast. COMPARISON:  CT the abdomen and pelvis 05/19/2005. FINDINGS: Lower chest: Aortic atherosclerosis. Mild scarring in the lung bases. Hepatobiliary: No definite suspicious cystic or solid hepatic lesions are confidently identified on today's noncontrast CT examination. Unenhanced appearance of the gallbladder is normal. Pancreas: No definite pancreatic mass or peripancreatic fluid collections or inflammatory changes noted on today's noncontrast CT examination. Spleen: Unremarkable. Adrenals/Urinary Tract: 3 tiny 1-2 mm nonobstructive calculi in the lower pole collecting system of the left kidney. In addition, in the proximal third of the left ureter (coronal image 39 of series 6) there is a 3 mm calculus which is associated with mild proximal hydroureteronephrosis. No additional calculi are identified within the right renal collecting system. However, in the distal third of the right ureter at the level of the right ureterovesicular junction (axial image 72 of series 3 and coronal image 50 of series 6) there is a 6 mm calculus. This is not associated with significant proximal right hydroureteronephrosis. In the anterior aspect of the upper pole of the left kidney there is a well-defined 1.8 cm low-attenuation lesion, incompletely characterized on today's noncontrast CT examination, but statistically likely a cyst. Unenhanced appearance of the right kidney and bilateral adrenal glands is otherwise unremarkable. Urinary bladder is normal in appearance. Stomach/Bowel: Unenhanced appearance of the stomach is normal. No pathologic dilatation of small bowel or colon. The appendix is not confidently identified and may be surgically absent. Regardless, there are no inflammatory changes noted  adjacent to the cecum to suggest the presence of an acute appendicitis at this time. Vascular/Lymphatic: Aortic atherosclerosis. No lymphadenopathy noted in the abdomen or pelvis. Reproductive: Prostate gland and seminal vesicles are unremarkable in appearance. Other: No significant volume of ascites.  No pneumoperitoneum. Musculoskeletal: There are no aggressive appearing lytic or blastic lesions noted in the visualized portions of the skeleton. IMPRESSION: 1. 3 mm structure of calculus in the proximal third of the left ureter with mild proximal left hydroureteronephrosis. 2. 6 mm calculus at the right ureterovesicular junction. At this time, there is no proximal right hydroureteronephrosis to indicate obstruction. 3. Three additional tiny 1-2 mm nonobstructive calculi in the lower pole collecting system of left kidney. 4. Aortic atherosclerosis. Electronically Signed   By: Vinnie Langton M.D.   On: 10/28/2018 21:54    Procedures Procedures (including critical care time)  Medications Ordered in ED Medications  sodium chloride 0.9 % bolus 1,000 mL (1,000 mLs Intravenous New Bag/Given 10/28/18 2124)  morphine 4 MG/ML injection 4 mg (4 mg Intravenous Given 10/28/18 2124)     Initial Impression / Assessment and Plan / ED Course  I have reviewed the triage vital signs and the nursing notes.  Pertinent labs & imaging results that were available during my care of the patient were reviewed by me and considered in my medical decision making (see chart for details).        Patient has what appears to be a 3 mm left ureteral stone.  Patient is feeling some improvement with the pain medications.  Will most likely be discharged with pain medication and referral to urology.  Did give the patient a second round of pain medicine here as he was still feeling some significant discomfort.  Patient will be reassessed and given the plan.  Follow-up with urology. Final Clinical Impressions(s) / ED Diagnoses    Final diagnoses:  None    ED Discharge Orders    None       Rebeca Allegra 10/28/18 2323    Quintella Reichert, MD 11/01/18 1058

## 2018-10-29 DIAGNOSIS — N201 Calculus of ureter: Secondary | ICD-10-CM | POA: Diagnosis not present

## 2018-11-02 DIAGNOSIS — M8589 Other specified disorders of bone density and structure, multiple sites: Secondary | ICD-10-CM | POA: Diagnosis not present

## 2018-11-08 DIAGNOSIS — M8580 Other specified disorders of bone density and structure, unspecified site: Secondary | ICD-10-CM | POA: Diagnosis not present

## 2018-11-08 DIAGNOSIS — R030 Elevated blood-pressure reading, without diagnosis of hypertension: Secondary | ICD-10-CM | POA: Diagnosis not present

## 2018-11-12 DIAGNOSIS — H35341 Macular cyst, hole, or pseudohole, right eye: Secondary | ICD-10-CM | POA: Diagnosis not present

## 2018-11-12 DIAGNOSIS — H25812 Combined forms of age-related cataract, left eye: Secondary | ICD-10-CM | POA: Diagnosis not present

## 2018-11-12 DIAGNOSIS — H348122 Central retinal vein occlusion, left eye, stable: Secondary | ICD-10-CM | POA: Diagnosis not present

## 2018-11-12 DIAGNOSIS — H35372 Puckering of macula, left eye: Secondary | ICD-10-CM | POA: Diagnosis not present

## 2018-11-12 DIAGNOSIS — H35012 Changes in retinal vascular appearance, left eye: Secondary | ICD-10-CM | POA: Diagnosis not present

## 2018-11-12 DIAGNOSIS — H1789 Other corneal scars and opacities: Secondary | ICD-10-CM | POA: Diagnosis not present

## 2018-11-12 DIAGNOSIS — H35371 Puckering of macula, right eye: Secondary | ICD-10-CM | POA: Diagnosis not present

## 2018-11-13 DIAGNOSIS — N201 Calculus of ureter: Secondary | ICD-10-CM | POA: Diagnosis not present

## 2018-11-16 DIAGNOSIS — I952 Hypotension due to drugs: Secondary | ICD-10-CM | POA: Diagnosis not present

## 2018-11-22 DIAGNOSIS — N400 Enlarged prostate without lower urinary tract symptoms: Secondary | ICD-10-CM | POA: Diagnosis not present

## 2018-11-22 DIAGNOSIS — R030 Elevated blood-pressure reading, without diagnosis of hypertension: Secondary | ICD-10-CM | POA: Diagnosis not present

## 2018-11-29 DIAGNOSIS — N201 Calculus of ureter: Secondary | ICD-10-CM | POA: Diagnosis not present

## 2018-12-17 DIAGNOSIS — I952 Hypotension due to drugs: Secondary | ICD-10-CM | POA: Diagnosis not present

## 2019-02-08 DIAGNOSIS — Z23 Encounter for immunization: Secondary | ICD-10-CM | POA: Diagnosis not present

## 2019-02-18 DIAGNOSIS — H52203 Unspecified astigmatism, bilateral: Secondary | ICD-10-CM | POA: Diagnosis not present

## 2019-02-18 DIAGNOSIS — H2513 Age-related nuclear cataract, bilateral: Secondary | ICD-10-CM | POA: Diagnosis not present

## 2019-02-18 DIAGNOSIS — H1789 Other corneal scars and opacities: Secondary | ICD-10-CM | POA: Diagnosis not present

## 2019-02-18 DIAGNOSIS — H35373 Puckering of macula, bilateral: Secondary | ICD-10-CM | POA: Diagnosis not present

## 2019-02-27 DIAGNOSIS — N281 Cyst of kidney, acquired: Secondary | ICD-10-CM | POA: Diagnosis not present

## 2019-02-27 DIAGNOSIS — N201 Calculus of ureter: Secondary | ICD-10-CM | POA: Diagnosis not present

## 2019-05-30 DIAGNOSIS — H35371 Puckering of macula, right eye: Secondary | ICD-10-CM | POA: Diagnosis not present

## 2019-05-30 DIAGNOSIS — H35341 Macular cyst, hole, or pseudohole, right eye: Secondary | ICD-10-CM | POA: Diagnosis not present

## 2019-05-30 DIAGNOSIS — H35372 Puckering of macula, left eye: Secondary | ICD-10-CM | POA: Diagnosis not present

## 2019-05-30 DIAGNOSIS — H25813 Combined forms of age-related cataract, bilateral: Secondary | ICD-10-CM | POA: Diagnosis not present

## 2019-05-30 DIAGNOSIS — H34812 Central retinal vein occlusion, left eye, with macular edema: Secondary | ICD-10-CM | POA: Diagnosis not present

## 2019-05-30 DIAGNOSIS — H1789 Other corneal scars and opacities: Secondary | ICD-10-CM | POA: Diagnosis not present

## 2019-07-09 DIAGNOSIS — R413 Other amnesia: Secondary | ICD-10-CM | POA: Diagnosis not present

## 2019-07-09 DIAGNOSIS — R0989 Other specified symptoms and signs involving the circulatory and respiratory systems: Secondary | ICD-10-CM | POA: Diagnosis not present

## 2019-08-01 DIAGNOSIS — I10 Essential (primary) hypertension: Secondary | ICD-10-CM | POA: Diagnosis not present

## 2019-10-10 DIAGNOSIS — E785 Hyperlipidemia, unspecified: Secondary | ICD-10-CM | POA: Diagnosis not present

## 2019-10-14 DIAGNOSIS — R350 Frequency of micturition: Secondary | ICD-10-CM | POA: Diagnosis not present

## 2019-10-14 DIAGNOSIS — Q676 Pectus excavatum: Secondary | ICD-10-CM | POA: Diagnosis not present

## 2019-10-14 DIAGNOSIS — D72819 Decreased white blood cell count, unspecified: Secondary | ICD-10-CM | POA: Diagnosis not present

## 2019-10-14 DIAGNOSIS — R0989 Other specified symptoms and signs involving the circulatory and respiratory systems: Secondary | ICD-10-CM | POA: Diagnosis not present

## 2019-10-14 DIAGNOSIS — R413 Other amnesia: Secondary | ICD-10-CM | POA: Diagnosis not present

## 2019-10-14 DIAGNOSIS — M8580 Other specified disorders of bone density and structure, unspecified site: Secondary | ICD-10-CM | POA: Diagnosis not present

## 2019-10-14 DIAGNOSIS — N200 Calculus of kidney: Secondary | ICD-10-CM | POA: Diagnosis not present

## 2019-10-14 DIAGNOSIS — Z0000 Encounter for general adult medical examination without abnormal findings: Secondary | ICD-10-CM | POA: Diagnosis not present

## 2019-10-14 DIAGNOSIS — R030 Elevated blood-pressure reading, without diagnosis of hypertension: Secondary | ICD-10-CM | POA: Diagnosis not present

## 2019-10-14 DIAGNOSIS — Z91018 Allergy to other foods: Secondary | ICD-10-CM | POA: Diagnosis not present

## 2019-10-14 DIAGNOSIS — N400 Enlarged prostate without lower urinary tract symptoms: Secondary | ICD-10-CM | POA: Diagnosis not present

## 2019-10-14 DIAGNOSIS — E785 Hyperlipidemia, unspecified: Secondary | ICD-10-CM | POA: Diagnosis not present

## 2019-10-31 DIAGNOSIS — I10 Essential (primary) hypertension: Secondary | ICD-10-CM | POA: Diagnosis not present

## 2019-11-20 ENCOUNTER — Other Ambulatory Visit: Payer: Self-pay

## 2019-11-20 ENCOUNTER — Encounter (INDEPENDENT_AMBULATORY_CARE_PROVIDER_SITE_OTHER): Payer: Self-pay | Admitting: Otolaryngology

## 2019-11-20 ENCOUNTER — Ambulatory Visit (INDEPENDENT_AMBULATORY_CARE_PROVIDER_SITE_OTHER): Payer: Medicare Other | Admitting: Otolaryngology

## 2019-11-20 VITALS — Temp 97.3°F

## 2019-11-20 DIAGNOSIS — H6123 Impacted cerumen, bilateral: Secondary | ICD-10-CM

## 2019-11-20 DIAGNOSIS — H903 Sensorineural hearing loss, bilateral: Secondary | ICD-10-CM | POA: Diagnosis not present

## 2019-11-20 NOTE — Progress Notes (Signed)
HPI: Jordan Lang is a 79 y.o. male who presents is referred by Jordan Lang for evaluation of hearing loss and wax buildup in his ears.  He does not wear hearing aids.  He presents today with his son who states that his father does not hear well..  Past Medical History:  Diagnosis Date  . Renal disorder    kidney stones   Past Surgical History:  Procedure Laterality Date  . HERNIA REPAIR     Social History   Socioeconomic History  . Marital status: Married    Spouse name: Not on file  . Number of children: Not on file  . Years of education: Not on file  . Highest education level: Not on file  Occupational History  . Not on file  Tobacco Use  . Smoking status: Never Smoker  . Smokeless tobacco: Never Used  Vaping Use  . Vaping Use: Never used  Substance and Sexual Activity  . Alcohol use: Yes    Comment: Social  . Drug use: Not on file  . Sexual activity: Not on file  Other Topics Concern  . Not on file  Social History Narrative  . Not on file   Social Determinants of Health   Financial Resource Strain:   . Difficulty of Paying Living Expenses:   Food Insecurity:   . Worried About Charity fundraiser in the Last Year:   . Arboriculturist in the Last Year:   Transportation Needs:   . Film/video editor (Medical):   Marland Kitchen Lack of Transportation (Non-Medical):   Physical Activity:   . Days of Exercise per Week:   . Minutes of Exercise per Session:   Stress:   . Feeling of Stress :   Social Connections:   . Frequency of Communication with Friends and Family:   . Frequency of Social Gatherings with Friends and Family:   . Attends Religious Services:   . Active Member of Clubs or Organizations:   . Attends Archivist Meetings:   Marland Kitchen Marital Status:    No family history on file. Allergies  Allergen Reactions  . Flomax [Tamsulosin Hcl]    Prior to Admission medications   Medication Sig Start Date End Date Taking? Authorizing Provider  Cholecalciferol  (VITAMIN D3) 2000 units TABS Take 1 tablet by mouth daily.   Yes [provider]  Multiple Vitamins-Minerals (CENTRUM ADULTS PO) Take 1 tablet by mouth.   Yes [provider]  oxyCODONE-acetaminophen (PERCOCET/ROXICET) 5-325 MG tablet Take 1 tablet by mouth every 6 (six) hours as needed for severe pain. 10/28/18  Yes Lang, Jordan Gave, PA-C  tamsulosin (FLOMAX) 0.4 MG CAPS capsule Take 1 capsule (0.4 mg total) by mouth daily. 10/28/18  Yes Lang, Jordan Gave, PA-C     Positive ROS: Otherwise negative  All other systems have been reviewed and were otherwise negative with the exception of those mentioned in the HPI and as above.  Physical Exam: Constitutional: Alert, well-appearing, no acute distress Ears: External ears without lesions or tenderness.  He had a moderate amount of wax buildup in both ears that was cleaned with suction and forceps as well several hairs within the ear canal.  The TMs were clear bilaterally. Nasal: External nose without lesions.. Clear nasal passages Oral: Lips and gums without lesions. Tongue and palate mucosa without lesions. Posterior oropharynx clear. Neck: No palpable adenopathy or masses Respiratory: Breathing comfortably  Skin: No facial/neck lesions or rash noted.  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  He had type A tympanograms bilaterally.  Cerumen impaction removal  Date/Time: 11/20/2019 1:54 PM Performed by: Jordan Nunnery, MD Authorized by: Jordan Nunnery, MD   Consent:    Consent obtained:  Verbal   Consent given by:  Patient   Risks discussed:  Pain and bleeding Procedure details:    Location:  L ear and R ear   Procedure type: suction and forceps   Post-procedure details:    Inspection:  TM intact and canal normal   Hearing quality:  Improved   Patient tolerance of procedure:  Tolerated  well, no immediate complications Comments:     TMs are clear bilaterally.    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  He would be a candidate for hearing aids if desired. Ear canals were cleaned in the office today.   Jordan Journey, MD   CC:

## 2019-11-25 ENCOUNTER — Encounter (INDEPENDENT_AMBULATORY_CARE_PROVIDER_SITE_OTHER): Payer: Self-pay

## 2019-12-05 DIAGNOSIS — I10 Essential (primary) hypertension: Secondary | ICD-10-CM | POA: Diagnosis not present

## 2019-12-12 DIAGNOSIS — H35341 Macular cyst, hole, or pseudohole, right eye: Secondary | ICD-10-CM | POA: Diagnosis not present

## 2019-12-12 DIAGNOSIS — H25812 Combined forms of age-related cataract, left eye: Secondary | ICD-10-CM | POA: Diagnosis not present

## 2019-12-12 DIAGNOSIS — H35372 Puckering of macula, left eye: Secondary | ICD-10-CM | POA: Diagnosis not present

## 2019-12-12 DIAGNOSIS — H35371 Puckering of macula, right eye: Secondary | ICD-10-CM | POA: Diagnosis not present

## 2019-12-12 DIAGNOSIS — H348122 Central retinal vein occlusion, left eye, stable: Secondary | ICD-10-CM | POA: Diagnosis not present

## 2020-01-02 DIAGNOSIS — I10 Essential (primary) hypertension: Secondary | ICD-10-CM | POA: Diagnosis not present

## 2020-02-25 DIAGNOSIS — H2513 Age-related nuclear cataract, bilateral: Secondary | ICD-10-CM | POA: Diagnosis not present

## 2020-02-25 DIAGNOSIS — H52203 Unspecified astigmatism, bilateral: Secondary | ICD-10-CM | POA: Diagnosis not present

## 2020-03-06 DIAGNOSIS — Z23 Encounter for immunization: Secondary | ICD-10-CM | POA: Diagnosis not present

## 2020-03-12 DIAGNOSIS — I10 Essential (primary) hypertension: Secondary | ICD-10-CM | POA: Diagnosis not present

## 2020-03-13 DIAGNOSIS — Z23 Encounter for immunization: Secondary | ICD-10-CM | POA: Diagnosis not present

## 2020-03-26 DIAGNOSIS — I10 Essential (primary) hypertension: Secondary | ICD-10-CM | POA: Diagnosis not present

## 2020-04-10 DIAGNOSIS — D72819 Decreased white blood cell count, unspecified: Secondary | ICD-10-CM | POA: Diagnosis not present

## 2020-04-10 DIAGNOSIS — I10 Essential (primary) hypertension: Secondary | ICD-10-CM | POA: Diagnosis not present

## 2020-04-10 DIAGNOSIS — E785 Hyperlipidemia, unspecified: Secondary | ICD-10-CM | POA: Diagnosis not present

## 2020-05-23 HISTORY — PX: CATARACT EXTRACTION: SUR2

## 2020-06-03 DIAGNOSIS — H25813 Combined forms of age-related cataract, bilateral: Secondary | ICD-10-CM | POA: Diagnosis not present

## 2020-06-03 DIAGNOSIS — H348122 Central retinal vein occlusion, left eye, stable: Secondary | ICD-10-CM | POA: Diagnosis not present

## 2020-06-03 DIAGNOSIS — H35341 Macular cyst, hole, or pseudohole, right eye: Secondary | ICD-10-CM | POA: Diagnosis not present

## 2020-06-03 DIAGNOSIS — H35012 Changes in retinal vascular appearance, left eye: Secondary | ICD-10-CM | POA: Diagnosis not present

## 2020-06-03 DIAGNOSIS — H35373 Puckering of macula, bilateral: Secondary | ICD-10-CM | POA: Diagnosis not present

## 2020-06-17 DIAGNOSIS — M25862 Other specified joint disorders, left knee: Secondary | ICD-10-CM | POA: Diagnosis not present

## 2020-07-13 DIAGNOSIS — D649 Anemia, unspecified: Secondary | ICD-10-CM | POA: Diagnosis not present

## 2020-07-13 DIAGNOSIS — E785 Hyperlipidemia, unspecified: Secondary | ICD-10-CM | POA: Diagnosis not present

## 2020-07-13 DIAGNOSIS — I10 Essential (primary) hypertension: Secondary | ICD-10-CM | POA: Diagnosis not present

## 2020-07-13 DIAGNOSIS — D72819 Decreased white blood cell count, unspecified: Secondary | ICD-10-CM | POA: Diagnosis not present

## 2020-07-14 DIAGNOSIS — I10 Essential (primary) hypertension: Secondary | ICD-10-CM | POA: Diagnosis not present

## 2020-07-14 DIAGNOSIS — D649 Anemia, unspecified: Secondary | ICD-10-CM | POA: Diagnosis not present

## 2020-09-29 DIAGNOSIS — K625 Hemorrhage of anus and rectum: Secondary | ICD-10-CM | POA: Diagnosis not present

## 2020-09-30 DIAGNOSIS — N402 Nodular prostate without lower urinary tract symptoms: Secondary | ICD-10-CM | POA: Diagnosis not present

## 2020-09-30 DIAGNOSIS — K625 Hemorrhage of anus and rectum: Secondary | ICD-10-CM | POA: Diagnosis not present

## 2020-10-06 ENCOUNTER — Other Ambulatory Visit: Payer: Self-pay | Admitting: Physician Assistant

## 2020-10-06 DIAGNOSIS — K625 Hemorrhage of anus and rectum: Secondary | ICD-10-CM | POA: Diagnosis not present

## 2020-10-15 DIAGNOSIS — I10 Essential (primary) hypertension: Secondary | ICD-10-CM | POA: Diagnosis not present

## 2020-10-15 DIAGNOSIS — D72819 Decreased white blood cell count, unspecified: Secondary | ICD-10-CM | POA: Diagnosis not present

## 2020-10-20 ENCOUNTER — Other Ambulatory Visit: Payer: Self-pay | Admitting: Internal Medicine

## 2020-10-20 DIAGNOSIS — R5383 Other fatigue: Secondary | ICD-10-CM | POA: Diagnosis not present

## 2020-10-20 DIAGNOSIS — I10 Essential (primary) hypertension: Secondary | ICD-10-CM | POA: Diagnosis not present

## 2020-10-20 DIAGNOSIS — D72819 Decreased white blood cell count, unspecified: Secondary | ICD-10-CM | POA: Diagnosis not present

## 2020-10-20 DIAGNOSIS — E785 Hyperlipidemia, unspecified: Secondary | ICD-10-CM | POA: Diagnosis not present

## 2020-10-20 DIAGNOSIS — Z0001 Encounter for general adult medical examination with abnormal findings: Secondary | ICD-10-CM | POA: Diagnosis not present

## 2020-10-20 DIAGNOSIS — N402 Nodular prostate without lower urinary tract symptoms: Secondary | ICD-10-CM | POA: Diagnosis not present

## 2020-10-20 DIAGNOSIS — M8580 Other specified disorders of bone density and structure, unspecified site: Secondary | ICD-10-CM | POA: Diagnosis not present

## 2020-10-21 ENCOUNTER — Other Ambulatory Visit: Payer: Medicare Other

## 2020-10-26 DIAGNOSIS — N402 Nodular prostate without lower urinary tract symptoms: Secondary | ICD-10-CM | POA: Diagnosis not present

## 2020-10-27 ENCOUNTER — Encounter (INDEPENDENT_AMBULATORY_CARE_PROVIDER_SITE_OTHER): Payer: Self-pay

## 2020-10-27 ENCOUNTER — Ambulatory Visit
Admission: RE | Admit: 2020-10-27 | Discharge: 2020-10-27 | Disposition: A | Discharge status: Home / Self Care | Payer: Medicare Other | Source: Ambulatory Visit | Attending: Physician Assistant | Admitting: Physician Assistant

## 2020-10-27 DIAGNOSIS — N200 Calculus of kidney: Secondary | ICD-10-CM | POA: Diagnosis not present

## 2020-10-27 DIAGNOSIS — K625 Hemorrhage of anus and rectum: Secondary | ICD-10-CM

## 2020-10-27 DIAGNOSIS — Z23 Encounter for immunization: Secondary | ICD-10-CM | POA: Diagnosis not present

## 2020-10-27 MED ORDER — IOPAMIDOL (ISOVUE-300) INJECTION 61%
100.0000 mL | Freq: Once | INTRAVENOUS | Status: AC | PRN
  Administered 2020-10-27: 100 mL via INTRAVENOUS

## 2020-11-03 ENCOUNTER — Other Ambulatory Visit: Payer: Self-pay | Admitting: Physician Assistant

## 2020-11-03 DIAGNOSIS — K869 Disease of pancreas, unspecified: Secondary | ICD-10-CM

## 2020-11-08 ENCOUNTER — Ambulatory Visit
Admission: RE | Admit: 2020-11-08 | Discharge: 2020-11-08 | Disposition: A | Discharge status: Home / Self Care | Payer: Medicare Other | Source: Ambulatory Visit | Attending: Physician Assistant | Admitting: Physician Assistant

## 2020-11-08 DIAGNOSIS — K869 Disease of pancreas, unspecified: Secondary | ICD-10-CM

## 2020-11-08 DIAGNOSIS — N281 Cyst of kidney, acquired: Secondary | ICD-10-CM | POA: Diagnosis not present

## 2020-11-08 DIAGNOSIS — D1803 Hemangioma of intra-abdominal structures: Secondary | ICD-10-CM | POA: Diagnosis not present

## 2020-11-08 MED ORDER — GADOBENATE DIMEGLUMINE 529 MG/ML IV SOLN
15.0000 mL | Freq: Once | INTRAVENOUS | Status: AC | PRN
  Administered 2020-11-08: 15 mL via INTRAVENOUS

## 2020-11-18 ENCOUNTER — Ambulatory Visit
Admission: RE | Admit: 2020-11-18 | Discharge: 2020-11-18 | Disposition: A | Discharge status: Home / Self Care | Payer: No Typology Code available for payment source | Source: Ambulatory Visit | Attending: Internal Medicine | Admitting: Internal Medicine

## 2020-11-18 DIAGNOSIS — I10 Essential (primary) hypertension: Secondary | ICD-10-CM

## 2020-11-19 DIAGNOSIS — R918 Other nonspecific abnormal finding of lung field: Secondary | ICD-10-CM | POA: Diagnosis not present

## 2020-11-19 DIAGNOSIS — I2510 Atherosclerotic heart disease of native coronary artery without angina pectoris: Secondary | ICD-10-CM | POA: Diagnosis not present

## 2020-12-03 DIAGNOSIS — R9389 Abnormal findings on diagnostic imaging of other specified body structures: Secondary | ICD-10-CM | POA: Diagnosis not present

## 2020-12-03 DIAGNOSIS — I10 Essential (primary) hypertension: Secondary | ICD-10-CM | POA: Diagnosis not present

## 2020-12-03 DIAGNOSIS — I2510 Atherosclerotic heart disease of native coronary artery without angina pectoris: Secondary | ICD-10-CM | POA: Diagnosis not present

## 2020-12-09 DIAGNOSIS — K625 Hemorrhage of anus and rectum: Secondary | ICD-10-CM | POA: Diagnosis not present

## 2020-12-09 DIAGNOSIS — K648 Other hemorrhoids: Secondary | ICD-10-CM | POA: Diagnosis not present

## 2020-12-14 ENCOUNTER — Ambulatory Visit: Payer: Self-pay | Admitting: Cardiology

## 2020-12-18 DIAGNOSIS — H25813 Combined forms of age-related cataract, bilateral: Secondary | ICD-10-CM | POA: Diagnosis not present

## 2020-12-18 DIAGNOSIS — H348122 Central retinal vein occlusion, left eye, stable: Secondary | ICD-10-CM | POA: Diagnosis not present

## 2020-12-18 DIAGNOSIS — H35012 Changes in retinal vascular appearance, left eye: Secondary | ICD-10-CM | POA: Diagnosis not present

## 2020-12-18 DIAGNOSIS — H35341 Macular cyst, hole, or pseudohole, right eye: Secondary | ICD-10-CM | POA: Diagnosis not present

## 2020-12-18 DIAGNOSIS — H35373 Puckering of macula, bilateral: Secondary | ICD-10-CM | POA: Diagnosis not present

## 2020-12-23 DIAGNOSIS — R195 Other fecal abnormalities: Secondary | ICD-10-CM | POA: Diagnosis not present

## 2020-12-23 DIAGNOSIS — K644 Residual hemorrhoidal skin tags: Secondary | ICD-10-CM | POA: Diagnosis not present

## 2021-01-18 IMAGING — CT CT RENAL STONE PROTOCOL
2 of 4 series · 15 of 46 positions shown, 17 images · non-contrast
Comparison: CT the abdomen and pelvis 05/19/2005.

CLINICAL DATA: 77-year-old male with history of left-sided flank
pain and nausea. History of kidney stones.

EXAM:
CT ABDOMEN AND PELVIS WITHOUT CONTRAST
TECHNIQUE: Multidetector CT imaging of the abdomen and pelvis was performed
following the standard protocol without IV contrast.

[Series 3: stone study 5.0 i30f 2 · axial · 0.72mm/px · z∈[+789,+1174]mm · 12 of 88 slices shown, 14 images]
[im 7/88  soft-tissue]
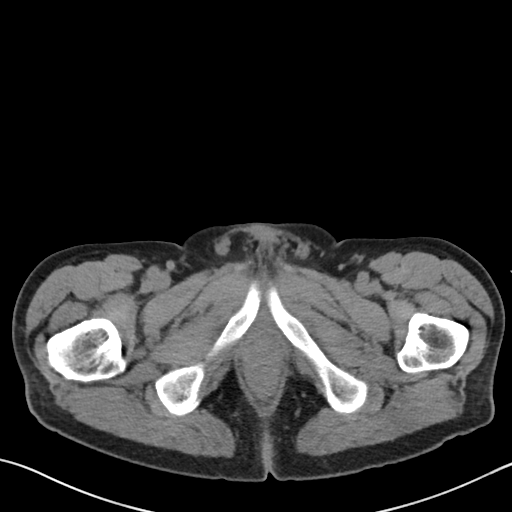
[im 7/88  bone]
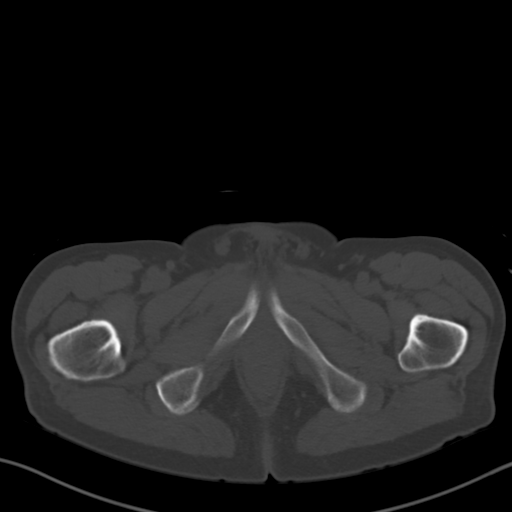
[im 14/88  soft-tissue]
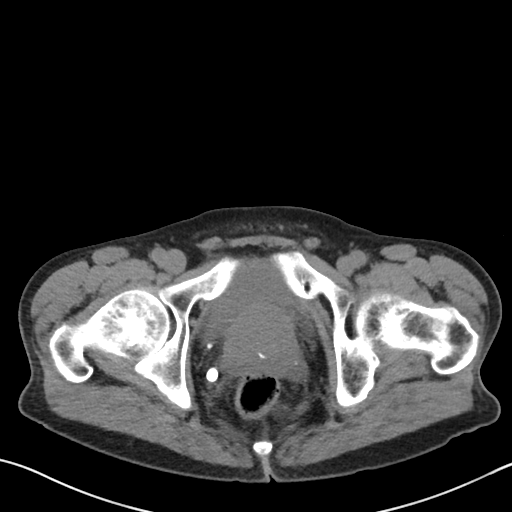
[im 21/88  soft-tissue]
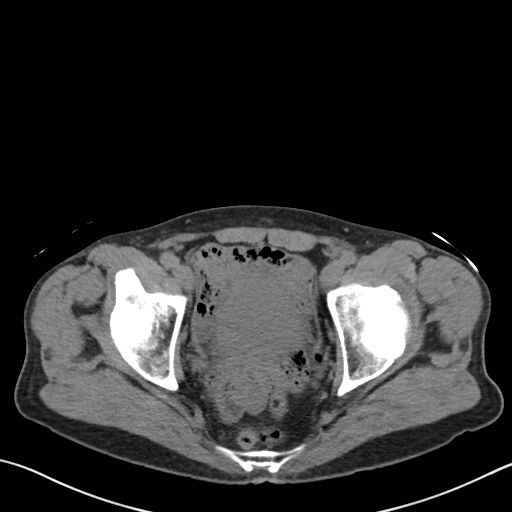
[im 28/88  soft-tissue]
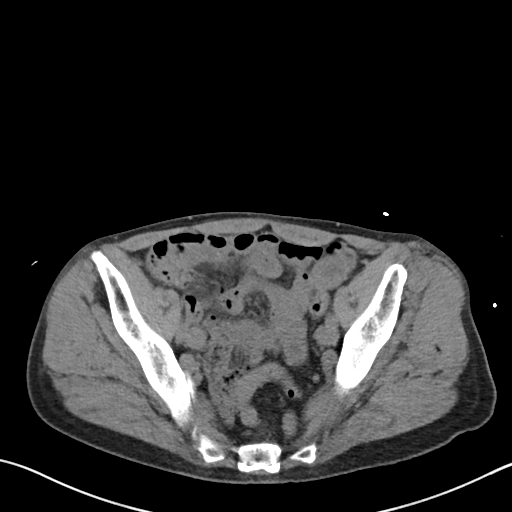
[im 35/88  soft-tissue]
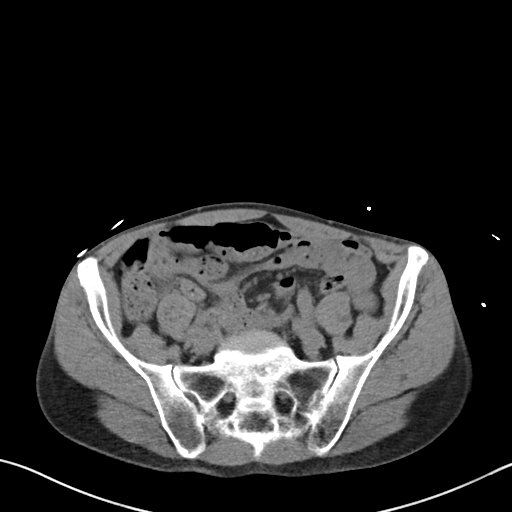
[im 42/88  soft-tissue]
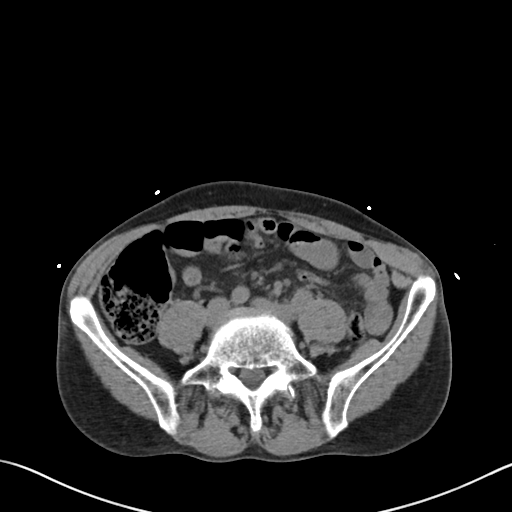
[im 49/88  soft-tissue]
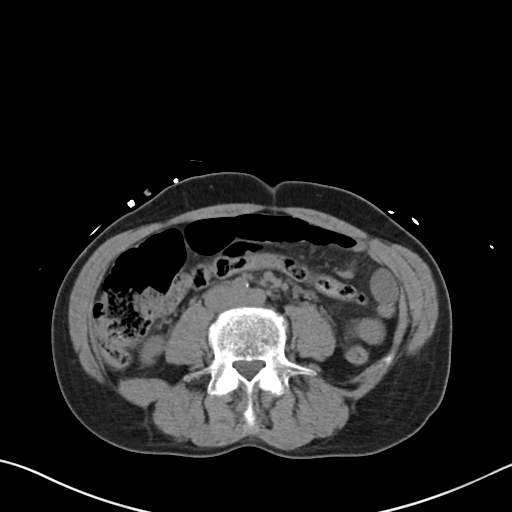
[im 56/88  soft-tissue]
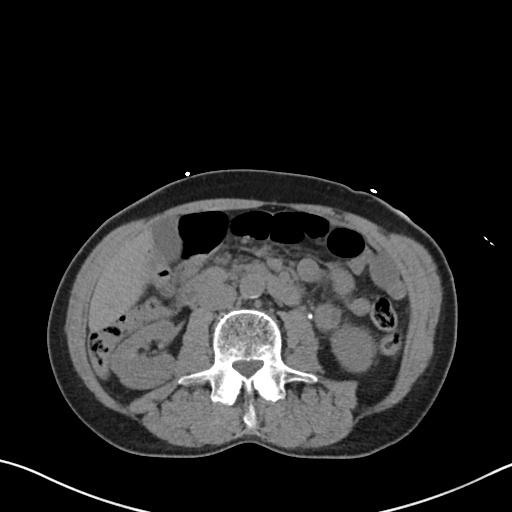
[im 63/88  soft-tissue]
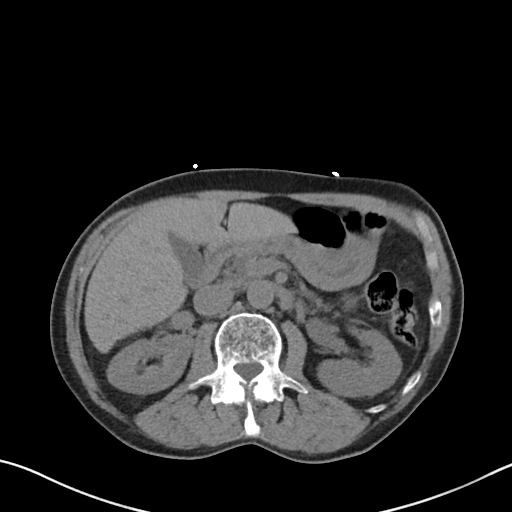
[im 63/88  bone]
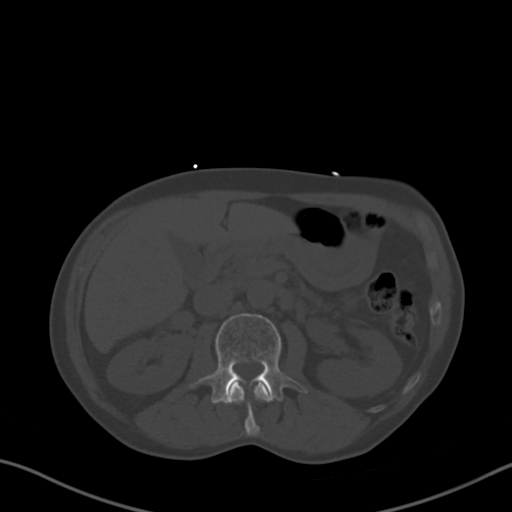
[im 70/88  soft-tissue]
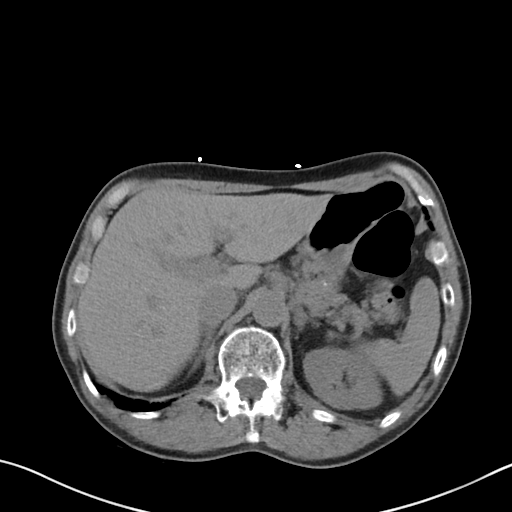
[im 77/88  soft-tissue]
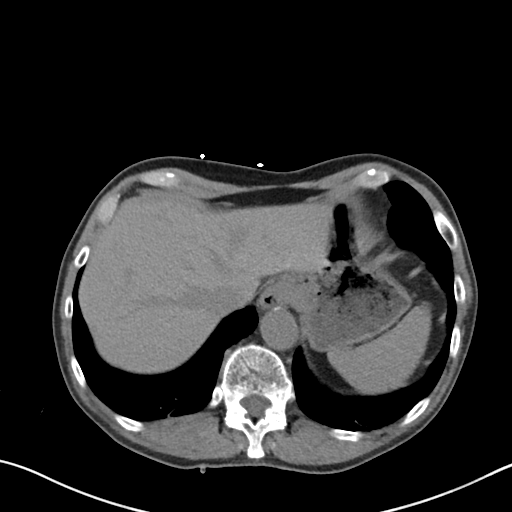
[im 84/88  soft-tissue]
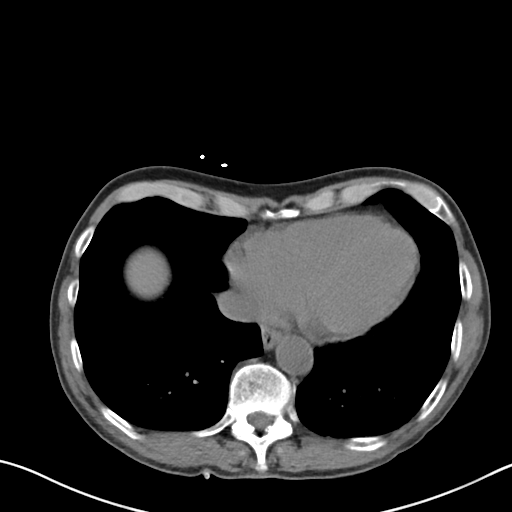

[Series 6: coronal soft tissue · coronal · 0.68mm/px · 3 of 77 slices shown]
[im 26/77  soft-tissue]
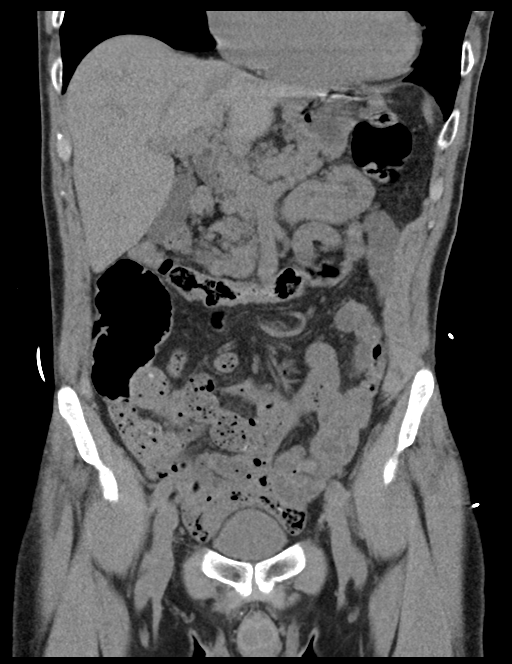
[im 34/77  soft-tissue]
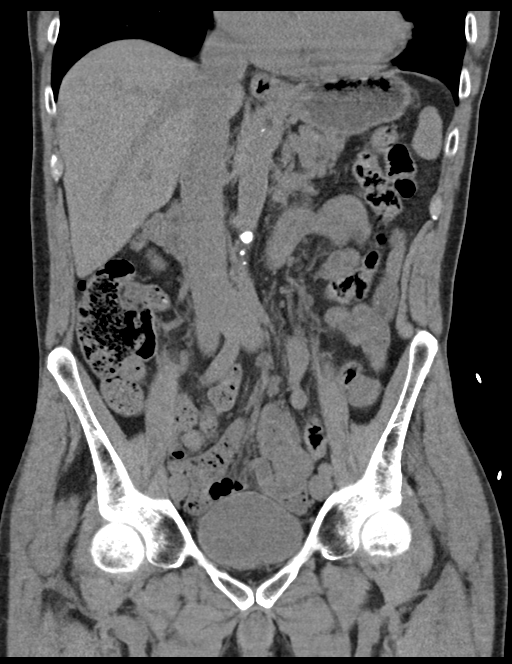
[im 43/77  soft-tissue]
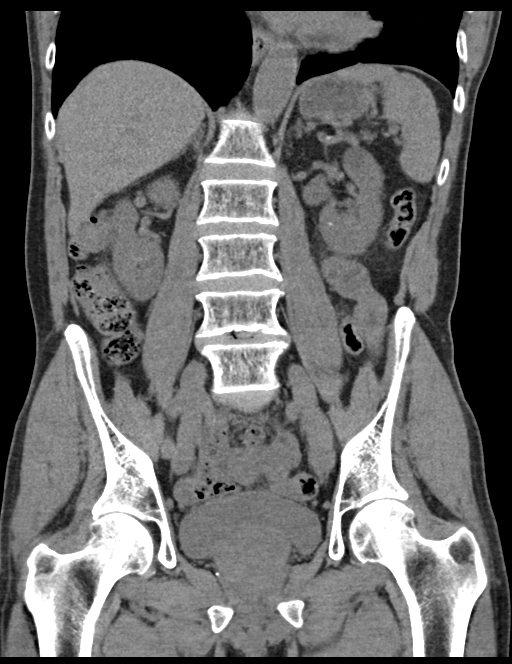

[15 of 46 positions shown; findings below may reference images not displayed]

FINDINGS: Lower chest: Aortic atherosclerosis. Mild scarring in the lung
bases.

Hepatobiliary: No definite suspicious cystic or solid hepatic
lesions are confidently identified on today's noncontrast CT
examination. Unenhanced appearance of the gallbladder is normal.

Pancreas: No definite pancreatic mass or peripancreatic fluid
collections or inflammatory changes noted on today's noncontrast CT
examination.

Spleen: Unremarkable.

Adrenals/Urinary Tract: 3 tiny 1-2 mm nonobstructive calculi in the
lower pole collecting system of the left kidney. In addition, in the
proximal third of the left ureter (coronal image 39 of series 6)
there is a 3 mm calculus which is associated with mild proximal
hydroureteronephrosis. No additional calculi are identified within
the right renal collecting system. However, in the distal third of
the right ureter at the level of the right ureterovesicular junction
(axial image 72 of series 3 and coronal image 50 of series 6) there
is a 6 mm calculus. This is not associated with significant proximal
right hydroureteronephrosis. In the anterior aspect of the upper
pole of the left kidney there is a well-defined 1.8 cm
low-attenuation lesion, incompletely characterized on today's
noncontrast CT examination, but statistically likely a cyst.
Unenhanced appearance of the right kidney and bilateral adrenal
glands is otherwise unremarkable. Urinary bladder is normal in
appearance.

Stomach/Bowel: Unenhanced appearance of the stomach is normal. No
pathologic dilatation of small bowel or colon. The appendix is not
confidently identified and may be surgically absent. Regardless,
there are no inflammatory changes noted adjacent to the cecum to
suggest the presence of an acute appendicitis at this time.

Vascular/Lymphatic: Aortic atherosclerosis. No lymphadenopathy noted
in the abdomen or pelvis.

Reproductive: Prostate gland and seminal vesicles are unremarkable
in appearance.

Other: No significant volume of ascites.  No pneumoperitoneum.

Musculoskeletal: There are no aggressive appearing lytic or blastic
lesions noted in the visualized portions of the skeleton.
IMPRESSION: 1. 3 mm structure of calculus in the proximal third of the left
ureter with mild proximal left hydroureteronephrosis.
2. 6 mm calculus at the right ureterovesicular junction. At this
time, there is no proximal right hydroureteronephrosis to indicate
obstruction.
3. Three additional tiny 1-2 mm nonobstructive calculi in the lower
pole collecting system of left kidney.
4. Aortic atherosclerosis.

## 2021-01-19 ENCOUNTER — Other Ambulatory Visit: Payer: Self-pay | Admitting: Internal Medicine

## 2021-01-19 DIAGNOSIS — R918 Other nonspecific abnormal finding of lung field: Secondary | ICD-10-CM

## 2021-01-26 DIAGNOSIS — K649 Unspecified hemorrhoids: Secondary | ICD-10-CM | POA: Diagnosis not present

## 2021-01-26 DIAGNOSIS — I10 Essential (primary) hypertension: Secondary | ICD-10-CM | POA: Diagnosis not present

## 2021-02-05 DIAGNOSIS — Z23 Encounter for immunization: Secondary | ICD-10-CM | POA: Diagnosis not present

## 2021-02-11 ENCOUNTER — Other Ambulatory Visit: Payer: Self-pay

## 2021-02-11 ENCOUNTER — Ambulatory Visit
Admission: RE | Admit: 2021-02-11 | Discharge: 2021-02-11 | Disposition: A | Discharge status: Home / Self Care | Payer: Medicare Other | Source: Ambulatory Visit | Attending: Internal Medicine | Admitting: Internal Medicine

## 2021-02-11 DIAGNOSIS — R918 Other nonspecific abnormal finding of lung field: Secondary | ICD-10-CM

## 2021-02-23 DIAGNOSIS — I10 Essential (primary) hypertension: Secondary | ICD-10-CM | POA: Diagnosis not present

## 2021-02-23 DIAGNOSIS — M8589 Other specified disorders of bone density and structure, multiple sites: Secondary | ICD-10-CM | POA: Diagnosis not present

## 2021-02-23 DIAGNOSIS — M810 Age-related osteoporosis without current pathological fracture: Secondary | ICD-10-CM | POA: Diagnosis not present

## 2021-03-02 DIAGNOSIS — H35373 Puckering of macula, bilateral: Secondary | ICD-10-CM | POA: Diagnosis not present

## 2021-03-02 DIAGNOSIS — H2513 Age-related nuclear cataract, bilateral: Secondary | ICD-10-CM | POA: Diagnosis not present

## 2021-03-02 DIAGNOSIS — H52203 Unspecified astigmatism, bilateral: Secondary | ICD-10-CM | POA: Diagnosis not present

## 2021-03-02 DIAGNOSIS — H1789 Other corneal scars and opacities: Secondary | ICD-10-CM | POA: Diagnosis not present

## 2021-03-30 DIAGNOSIS — M810 Age-related osteoporosis without current pathological fracture: Secondary | ICD-10-CM | POA: Diagnosis not present

## 2021-04-12 ENCOUNTER — Ambulatory Visit: Payer: Self-pay | Admitting: Surgery

## 2021-04-12 DIAGNOSIS — R739 Hyperglycemia, unspecified: Secondary | ICD-10-CM

## 2021-04-12 DIAGNOSIS — K529 Noninfective gastroenteritis and colitis, unspecified: Secondary | ICD-10-CM | POA: Diagnosis not present

## 2021-04-12 DIAGNOSIS — K642 Third degree hemorrhoids: Secondary | ICD-10-CM | POA: Diagnosis not present

## 2021-04-23 DIAGNOSIS — H2513 Age-related nuclear cataract, bilateral: Secondary | ICD-10-CM | POA: Diagnosis not present

## 2021-04-23 DIAGNOSIS — H35373 Puckering of macula, bilateral: Secondary | ICD-10-CM | POA: Diagnosis not present

## 2021-04-23 DIAGNOSIS — H1789 Other corneal scars and opacities: Secondary | ICD-10-CM | POA: Diagnosis not present

## 2021-05-13 ENCOUNTER — Encounter (AMBULATORY_SURGERY_CENTER): Payer: Medicare Other | Admitting: Ophthalmology

## 2021-05-13 DIAGNOSIS — H59212 Accidental puncture and laceration of left eye and adnexa during an ophthalmic procedure: Secondary | ICD-10-CM | POA: Diagnosis not present

## 2021-05-13 DIAGNOSIS — H59022 Cataract (lens) fragments in eye following cataract surgery, left eye: Secondary | ICD-10-CM | POA: Diagnosis not present

## 2021-05-13 DIAGNOSIS — H2512 Age-related nuclear cataract, left eye: Secondary | ICD-10-CM | POA: Diagnosis not present

## 2021-05-13 DIAGNOSIS — T85398A Other mechanical complication of other ocular prosthetic devices, implants and grafts, initial encounter: Secondary | ICD-10-CM

## 2021-05-13 DIAGNOSIS — H4302 Vitreous prolapse, left eye: Secondary | ICD-10-CM

## 2021-05-13 DIAGNOSIS — Z5309 Procedure and treatment not carried out because of other contraindication: Secondary | ICD-10-CM | POA: Diagnosis not present

## 2021-05-13 DIAGNOSIS — H2702 Aphakia, left eye: Secondary | ICD-10-CM

## 2021-05-13 DIAGNOSIS — H25812 Combined forms of age-related cataract, left eye: Secondary | ICD-10-CM | POA: Diagnosis not present

## 2021-05-13 DIAGNOSIS — H21562 Pupillary abnormality, left eye: Secondary | ICD-10-CM | POA: Diagnosis not present

## 2021-05-19 ENCOUNTER — Ambulatory Visit (INDEPENDENT_AMBULATORY_CARE_PROVIDER_SITE_OTHER): Payer: Self-pay | Admitting: Ophthalmology

## 2021-05-19 DIAGNOSIS — H59022 Cataract (lens) fragments in eye following cataract surgery, left eye: Secondary | ICD-10-CM | POA: Insufficient documentation

## 2021-05-19 DIAGNOSIS — H2702 Aphakia, left eye: Secondary | ICD-10-CM | POA: Insufficient documentation

## 2021-05-19 NOTE — Progress Notes (Signed)
05-13-2021     CHIEF COMPLAINT Patient presents for  Chief Complaint  Patient presents with   Retina Evaluation   Examination at bedside preop holding area [place], [place]   HISTORY OF PRESENT ILLNESS: Jordan Lang is a 80 y.o. male who presents to the clinic today for:   HPI     Retina Evaluation           Laterality: left eye   Onset: 1 hour ago   Associated Symptoms: Floaters   MD Performed: performed the HPI with the patient and updated documentation appropriately         Comments   Patient undergoing cataract surgery with intraocular lens placement direction Dr. 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  Patient accepted in consultation and transfer of care to [place] on [address] bedside consultation and examination was carried out as the patient required urgent procession to OR for completion of procedure and placement of intraocular lens to accomplish goal of pseudophakic condition OS  Examination carried out in the preop holding area with the patient's daughter and present, Jordan Lang, who understood the need for completion of surgical procedure by vitrectomy, removal of retained lens fragments, placement of secondary intraocular lens in the sulcus, and removal of the Maluygin ring      Last edited by Hurman Horn, MD on 05/19/2021  8:32 AM.      Referring physician: No referring provider defined for this encounter.  HISTORICAL INFORMATION:   Selected notes from the MEDICAL RECORD NUMBER       CURRENT MEDICATIONS: No current outpatient medications on file. (Ophthalmic Drugs)   No current facility-administered medications for this visit. (Ophthalmic Drugs)   Current Outpatient Medications (Other)  Medication Sig    Cholecalciferol (VITAMIN D3) 2000 units TABS Take 1 tablet by mouth daily.   Multiple Vitamins-Minerals (CENTRUM ADULTS PO) Take 1 tablet by mouth.   oxyCODONE-acetaminophen (PERCOCET/ROXICET) 5-325 MG tablet Take 1 tablet by mouth every 6 (six) hours as needed for severe pain.   tamsulosin (FLOMAX) 0.4 MG CAPS capsule Take 1 capsule (0.4 mg total) by mouth daily.   No current facility-administered medications for this visit. (Other)      REVIEW OF SYSTEMS: ROS   Negative for: Constitutional, Gastrointestinal, Neurological, Skin, Genitourinary, Musculoskeletal, HENT, Endocrine, Cardiovascular, Eyes, Respiratory, Psychiatric, Allergic/Imm, Heme/Lymph Last edited by Hurman Horn, MD on 05/19/2021  8:32 AM.       ALLERGIES Allergies  Allergen Reactions   Flomax [Tamsulosin Hcl]     PAST MEDICAL HISTORY Past Medical History:  Diagnosis Date   Renal disorder    kidney stones   Past Surgical History:  Procedure Laterality Date   HERNIA REPAIR      FAMILY HISTORY No family history on file.  SOCIAL HISTORY Social History   Tobacco Use   Smoking status: Never   Smokeless tobacco: Never  Vaping Use   Vaping Use: Never used  Substance Use Topics   Alcohol use: Yes    Comment: Social         OPHTHALMIC EXAM:  Slit Lamp and Fundus Exam     External Exam       Right Left   External  Normal         Slit Lamp Exam  Right Left   Lids/Lashes  Normal   Conjunctiva/Sclera  Injection   Cornea  Clear, secured   Iris  Pupil well dilated no details of Maluygin ring at the bedside examination in the holding area of the preop region            IMAGING AND PROCEDURES  Imaging and Procedures for 05/19/21           ASSESSMENT/PLAN:  Aphakia of left eye Surgical correction undertaken 1220 06-2020, via vitrectomy, secondary insertion intraocular lens, sulcus  Surgical risk and benefits were discussed with the patient but with his onboard  sedation from previous surgery earlier that morning, his daughter approved for proceeding with surgical intervention for the left eye to correct the aphakic condition, restore the the plan pseudophakic condition, remove retained lens fragments and remove the Maluygin ring which had intentionally be left in place so as to minimize iris trauma prior to completion of the surgical procedure  Retained lens material following cataract surgery of left eye Patient accepted in transfer to [place], [place].,  Whereupon vitrectomy was carried out removal of retained lens fragments, removal of vitreous prolapse left eye as well as secondary insertion of intraocular lens placement, and removal of Maluygin ring     ICD-10-CM   1. Aphakia of left eye  H27.02     2. Retained lens material following cataract surgery of left eye  H59.022       1.  Surgical indications reviewed with the patient and family, Jordan Lang, who understood the need for proceeding with cataract lens fragments removal of the left eye under local anesthesia and restoration proceeding with planned pseudophakic condition, by insertion secondary IOL and removal of retained lens fragments as well as the finally the [place] ring which been placed and left in place from prior surgery earlier today so as to minimize iris trauma  2.  Risk and benefits well reviewed and consent signed  3.  Plan vitrectomy, removal retained lens fragments, insertion secondary well, and finally removal Maluygin ring  Ophthalmic Meds Ordered this visit:  No orders of the defined types were placed in this encounter.      Return for Follow-up Dr. Tyler Aas on 05-14-2021.  There are no Patient Instructions on file for this visit.   Explained the diagnoses, plan, and follow up with the patient and they expressed understanding.  Patient expressed understanding of the importance of proper follow up care.   Jordan Demark Nakyia Dau M.D. Diseases &  Surgery of the Retina and Vitreous Retina & Diabetic [place] 05/19/21     Abbreviations: M myopia (nearsighted); A astigmatism; H hyperopia (farsighted); P presbyopia; Mrx spectacle prescription;  CTL contact lenses; OD right eye; OS left eye; OU both eyes  XT exotropia; ET esotropia; PEK punctate epithelial keratitis; PEE punctate epithelial erosions; DES dry eye syndrome; MGD meibomian gland dysfunction; ATs artificial tears; PFAT's preservative free artificial tears; [place] nuclear sclerotic cataract; PSC posterior subcapsular cataract; ERM epi-retinal membrane; PVD posterior vitreous detachment; RD retinal detachment; DM diabetes mellitus; DR diabetic retinopathy; NPDR non-proliferative diabetic retinopathy; PDR proliferative diabetic retinopathy; CSME clinically significant macular edema; DME diabetic macular edema; dbh dot blot hemorrhages; CWS cotton wool spot; POAG primary open angle glaucoma; C/D cup-to-disc ratio; HVF humphrey visual field; GVF goldmann visual field; OCT optical coherence tomography; IOP intraocular pressure; BRVO Branch retinal vein occlusion; CRVO central retinal vein occlusion; CRAO central retinal artery occlusion; BRAO branch retinal artery occlusion; RT retinal tear; SB scleral buckle;  PPV pars plana vitrectomy; VH Vitreous hemorrhage; PRP panretinal laser photocoagulation; IVK intravitreal kenalog; VMT vitreomacular traction; MH Macular hole;  NVD neovascularization of the disc; NVE neovascularization elsewhere; AREDS age related eye disease study; ARMD age related macular degeneration; POAG primary open angle glaucoma; EBMD epithelial/anterior basement membrane dystrophy; ACIOL anterior chamber intraocular lens; IOL intraocular lens; PCIOL posterior chamber intraocular lens; Phaco/IOL phacoemulsification with intraocular lens placement; [place] photorefractive keratectomy; LASIK laser assisted in situ keratomileusis; HTN hypertension; DM diabetes mellitus; COPD chronic  obstructive pulmonary disease

## 2021-05-19 NOTE — Assessment & Plan Note (Addendum)
Surgical correction undertaken 1220 06-2020, via vitrectomy, secondary insertion intraocular lens, sulcus  Surgical risk and benefits were discussed with the patient but with his onboard sedation from previous surgery earlier that morning, his daughter approved for proceeding with surgical intervention for the left eye to correct the aphakic condition, restore the the plan pseudophakic condition, remove retained lens fragments and remove the Maluygin ring which had intentionally be left in place so as to minimize iris trauma prior to completion of the surgical procedure

## 2021-05-19 NOTE — Assessment & Plan Note (Signed)
Patient accepted in transfer to [place], [place].,  Whereupon vitrectomy was carried out removal of retained lens fragments, removal of vitreous prolapse left eye as well as secondary insertion of intraocular lens placement, and removal of Maluygin ring

## 2021-05-28 DIAGNOSIS — K649 Unspecified hemorrhoids: Secondary | ICD-10-CM | POA: Diagnosis not present

## 2021-05-28 DIAGNOSIS — K642 Third degree hemorrhoids: Secondary | ICD-10-CM | POA: Diagnosis not present

## 2021-06-06 DIAGNOSIS — Z23 Encounter for immunization: Secondary | ICD-10-CM | POA: Diagnosis not present

## 2021-06-22 DIAGNOSIS — Z961 Presence of intraocular lens: Secondary | ICD-10-CM | POA: Diagnosis not present

## 2021-06-22 DIAGNOSIS — H59032 Cystoid macular edema following cataract surgery, left eye: Secondary | ICD-10-CM | POA: Diagnosis not present

## 2021-06-22 DIAGNOSIS — H25811 Combined forms of age-related cataract, right eye: Secondary | ICD-10-CM | POA: Diagnosis not present

## 2021-06-22 DIAGNOSIS — H3562 Retinal hemorrhage, left eye: Secondary | ICD-10-CM | POA: Diagnosis not present

## 2021-06-22 DIAGNOSIS — H34812 Central retinal vein occlusion, left eye, with macular edema: Secondary | ICD-10-CM | POA: Diagnosis not present

## 2021-06-22 DIAGNOSIS — H35341 Macular cyst, hole, or pseudohole, right eye: Secondary | ICD-10-CM | POA: Diagnosis not present

## 2021-06-28 DIAGNOSIS — H3582 Retinal ischemia: Secondary | ICD-10-CM | POA: Diagnosis not present

## 2021-06-28 DIAGNOSIS — H35372 Puckering of macula, left eye: Secondary | ICD-10-CM | POA: Diagnosis not present

## 2021-06-28 DIAGNOSIS — Z961 Presence of intraocular lens: Secondary | ICD-10-CM | POA: Diagnosis not present

## 2021-06-28 DIAGNOSIS — H1789 Other corneal scars and opacities: Secondary | ICD-10-CM | POA: Diagnosis not present

## 2021-06-28 DIAGNOSIS — H348122 Central retinal vein occlusion, left eye, stable: Secondary | ICD-10-CM | POA: Diagnosis not present

## 2021-07-05 ENCOUNTER — Encounter (INDEPENDENT_AMBULATORY_CARE_PROVIDER_SITE_OTHER): Payer: Medicare Other | Admitting: Ophthalmology

## 2021-07-05 ENCOUNTER — Other Ambulatory Visit: Payer: Self-pay

## 2021-07-07 DIAGNOSIS — H35352 Cystoid macular degeneration, left eye: Secondary | ICD-10-CM | POA: Diagnosis not present

## 2021-07-15 ENCOUNTER — Encounter (INDEPENDENT_AMBULATORY_CARE_PROVIDER_SITE_OTHER): Payer: Medicare Other | Admitting: Ophthalmology

## 2021-07-21 ENCOUNTER — Encounter (INDEPENDENT_AMBULATORY_CARE_PROVIDER_SITE_OTHER): Payer: Medicare Other | Admitting: Ophthalmology

## 2021-07-22 ENCOUNTER — Encounter (INDEPENDENT_AMBULATORY_CARE_PROVIDER_SITE_OTHER): Payer: Self-pay | Admitting: Ophthalmology

## 2021-07-22 ENCOUNTER — Ambulatory Visit (INDEPENDENT_AMBULATORY_CARE_PROVIDER_SITE_OTHER): Payer: Medicare Other | Admitting: Ophthalmology

## 2021-07-22 ENCOUNTER — Other Ambulatory Visit: Payer: Self-pay

## 2021-07-22 DIAGNOSIS — H59022 Cataract (lens) fragments in eye following cataract surgery, left eye: Secondary | ICD-10-CM

## 2021-07-22 DIAGNOSIS — H348122 Central retinal vein occlusion, left eye, stable: Secondary | ICD-10-CM

## 2021-07-22 DIAGNOSIS — H2702 Aphakia, left eye: Secondary | ICD-10-CM

## 2021-07-22 DIAGNOSIS — H2511 Age-related nuclear cataract, right eye: Secondary | ICD-10-CM

## 2021-07-22 DIAGNOSIS — H35372 Puckering of macula, left eye: Secondary | ICD-10-CM | POA: Diagnosis not present

## 2021-07-22 NOTE — Assessment & Plan Note (Signed)
Minor seen best on OCT mostly temporal to fovea, not involving the fovea.  Observe ?

## 2021-07-22 NOTE — Assessment & Plan Note (Signed)
Condition corrected now pseudophakia, 20/50 vision may be limiting vision from prior Hemi CRV O. ? ?Probably no role of the epiretinal membrane seen on clinical exam and on OCT ?

## 2021-07-22 NOTE — Assessment & Plan Note (Signed)
Looks great postsurgically OS from nearly 3 months previous with clear media, well centered IOL ?

## 2021-07-22 NOTE — Assessment & Plan Note (Signed)
OD, consideration as per Dr. Ellie Lunch, I discussed with the patient that the floppy iris that she encountered at time of surgery is very likely going to occur at the in the right eye.  Yet the visual, would probably not be as limited in the right eye as the left because the left eye is a history of previous RV O ?

## 2021-07-22 NOTE — Assessment & Plan Note (Signed)
Old RV O OS by history but also by findings with collateralized superior hemifield CRVO findings, no active macular edema and no active hemorrhaging, no active complications, continue to observe ?

## 2021-07-22 NOTE — Progress Notes (Signed)
07/22/2021     CHIEF COMPLAINT Patient presents for  Chief Complaint  Patient presents with   Post-op Follow-up      HISTORY OF PRESENT ILLNESS: Jordan Lang is a 81 y.o. male who presents to the clinic today for:   HPI   9 weeks PO visit sx 05/13/21, OS.  Pt states "my left eye is still not as good as before the surgery, but to my understanding it will get better at the removal of the last stitch. It has improved at the removal of each stitch so far." Pt is using Prolensa QD OS and Prednisolone TID OS.   Last edited by Hurman Horn, MD on 07/22/2021 10:58 AM.      Referring physician: Luberta Mutter, MD [place],  [place] [address]  HISTORICAL INFORMATION:   Selected notes from the MEDICAL RECORD NUMBER       CURRENT MEDICATIONS: No current outpatient medications on file. (Ophthalmic Drugs)   No current facility-administered medications for this visit. (Ophthalmic Drugs)   Current Outpatient Medications (Other)  Medication Sig   Cholecalciferol (VITAMIN D3) 2000 units TABS Take 1 tablet by mouth daily.   Multiple Vitamins-Minerals (CENTRUM ADULTS PO) Take 1 tablet by mouth.   oxyCODONE-acetaminophen (PERCOCET/ROXICET) 5-325 MG tablet Take 1 tablet by mouth every 6 (six) hours as needed for severe pain.   tamsulosin (FLOMAX) 0.4 MG CAPS capsule Take 1 capsule (0.4 mg total) by mouth daily.   No current facility-administered medications for this visit. (Other)      REVIEW OF SYSTEMS: ROS   Negative for: Constitutional, Gastrointestinal, Neurological, Skin, Genitourinary, Musculoskeletal, HENT, Endocrine, Cardiovascular, Eyes, Respiratory, Psychiatric, Allergic/Imm, Heme/Lymph Last edited by Hurman Horn, MD on 07/22/2021 10:58 AM.       ALLERGIES Allergies  Allergen Reactions   Flomax [Tamsulosin Hcl]     PAST MEDICAL HISTORY Past Medical History:  Diagnosis Date   Renal disorder    kidney stones   Past Surgical History:   Procedure Laterality Date   HERNIA REPAIR      FAMILY HISTORY History reviewed. No pertinent family history.  SOCIAL HISTORY Social History   Tobacco Use   Smoking status: Never   Smokeless tobacco: Never  Vaping Use   Vaping Use: Never used  Substance Use Topics   Alcohol use: Yes    Comment: Social         OPHTHALMIC EXAM:  Base Eye Exam     Visual Acuity (ETDRS)       Right Left   Dist [place] 20/20 -2    Dist cc  20/50 -2   Dist ph cc  20/25 -1         Tonometry (Tonopen, 10:20 AM)       Right Left   Pressure 26 22         Pupils       Dark Light Shape React APD   Right 2 1 Round Minimal None   Left   Irregular Minimal None         Visual Fields (Counting fingers)       Left Right    Full Full         Extraocular Movement       Right Left    Full Full         Neuro/Psych     Oriented x3: Yes   Mood/Affect: Normal         Dilation  Left eye: 1.0% Mydriacyl, 2.5% Phenylephrine @ 10:20 AM           Slit Lamp and Fundus Exam     External Exam       Right Left   External Normal Normal         Slit Lamp Exam       Right Left   Lids/Lashes Normal Normal   Conjunctiva/Sclera White and quiet Injection   Cornea Clear Clear, secured   Anterior Chamber Deep and quiet Deep and quiet   Iris Round and reactive Pharmacologically dilated, with some iris atrophy temporally in the region of postsurgical trauma from Malyugin ring extraction   Lens 2+ Nuclear sclerosis Centered posterior chamber intraocular lens   Anterior Vitreous Normal Clear media         Fundus Exam       Right Left   Posterior Vitreous  Clear, vitrectomized   Disc  Collateralized old vessel superior Hemi vein, no active NVE   C/D Ratio Not dilated 0.55   Macula  Minor epiretinal membrane temporally no foveal distortion   Vessels  Compensated, stable old superior Hemi RVF   Periphery  Normal            IMAGING AND PROCEDURES  Imaging  and Procedures for 07/22/21  OCT, Retina - OU - Both Eyes       Right Eye Quality was good. Scan locations included subfoveal. Central Foveal Thickness: 270. Progression has no prior data. Findings include abnormal foveal contour.   Left Eye Central Foveal Thickness: 355. Progression has no prior data. Findings include abnormal foveal contour, epiretinal membrane.   Notes OD, with no active maculopathy but some irregular foveal contour, this could be some early tangential traction from a minor epiretinal membrane but no secondary thickening.  Nonetheless this is not an operative condition or medical condition and deserves observation alone  OS no appreciable thickening from epiretinal membrane nor macular edema observe     Color Fundus Photography Optos - OU - Both Eyes       Right Eye Progression has no prior data.   Left Eye Progression has no prior data. Disc findings include increased cup to disc ratio, pallor.   Notes OD no views through cataract  OS optic nerve with collateralization of the superior half of the nerve at the RVO site, no active maculopathy, minor ERM temporally             ASSESSMENT/PLAN:  Retained lens material following cataract surgery of left eye Looks great postsurgically OS from nearly 3 months previous with clear media, well centered IOL  Aphakia of left eye Condition corrected now pseudophakia, 20/50 vision may be limiting vision from prior Hemi CRV O.  Probably no role of the epiretinal membrane seen on clinical exam and on OCT  Left epiretinal membrane Minor seen best on OCT mostly temporal to fovea, not involving the fovea.  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 Ellie Lunch, I discussed with the patient that the floppy iris that she encountered at time of surgery is very likely going to occur at the in the right eye.  Yet the visual, would probably not be as limited in the right eye  as the left because the left eye is a history of previous RV O     ICD-10-CM   1. Left epiretinal membrane  H35.372 OCT, Retina - OU - Both Eyes    Color Fundus Photography Optos - OU - Both Eyes    2. Retained lens material following cataract surgery of left eye  H59.022     3. Aphakia of left eye  H27.02     4. 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 Nuclear sclerotic cataract of right eye  H25.11       1.  OS looks great, after restoration of the pseudophakic condition left eye via vitrectomy, removal retained lens fragments, secondary insertion of PCIOL and removal Maluygin ring.  Some iris atrophy remains as a consequence of Maluygin ring removal, overall vision slightly limited likely from old RV O.  2.  Nuclear sclerotic cataract OD considerations as per patient symptoms and Dr. Dr. Benna Dunks discussions  3.  Follow-up here on a as needed basis  Ophthalmic Meds Ordered this visit:  No orders of the defined types were placed in this encounter.      Return if symptoms worsen or fail to improve, for Follow-up here as per Dr. Benna Dunks judgment.  There are no Patient Instructions on file for this visit.   Explained the diagnoses, plan, and follow up with the patient and they expressed understanding.  Patient expressed understanding of the importance of proper follow up care.   Jordan Lang M.D. Diseases & Surgery of the Retina and Vitreous Retina & Diabetic [place] 07/22/21     Abbreviations: M myopia (nearsighted); A astigmatism; H hyperopia (farsighted); P presbyopia; Mrx spectacle prescription;  CTL contact lenses; OD right  eye; OS left eye; OU both eyes  XT exotropia; ET esotropia; PEK punctate epithelial keratitis; PEE punctate epithelial erosions; DES dry eye syndrome; MGD meibomian gland dysfunction; ATs artificial tears; PFAT's preservative free artificial tears; [place] nuclear sclerotic cataract; PSC posterior subcapsular cataract; ERM epi-retinal membrane; PVD posterior vitreous detachment; RD retinal detachment; DM diabetes mellitus; DR diabetic retinopathy; NPDR non-proliferative diabetic retinopathy; PDR proliferative diabetic retinopathy; CSME clinically significant macular edema; DME diabetic macular edema; dbh dot blot hemorrhages; CWS cotton wool spot; POAG primary open angle glaucoma; C/D cup-to-disc ratio; HVF humphrey visual field; GVF goldmann visual field; OCT optical coherence tomography; IOP intraocular pressure; BRVO Branch retinal vein occlusion; CRVO central retinal vein occlusion; CRAO central retinal artery occlusion; BRAO branch retinal artery occlusion; RT retinal tear; SB scleral buckle; PPV pars plana vitrectomy; VH Vitreous hemorrhage; PRP panretinal laser photocoagulation; IVK intravitreal kenalog; VMT vitreomacular traction; MH Macular hole;  NVD neovascularization of the disc; NVE neovascularization elsewhere; AREDS age related eye disease study; ARMD age related macular degeneration; POAG primary open angle glaucoma; EBMD epithelial/anterior basement membrane dystrophy; ACIOL anterior chamber intraocular lens; IOL intraocular lens; PCIOL posterior chamber intraocular lens; Phaco/IOL phacoemulsification with intraocular lens placement; [place] photorefractive keratectomy; LASIK laser assisted in situ keratomileusis; HTN hypertension; DM diabetes mellitus; COPD chronic obstructive pulmonary disease

## 2021-09-08 DIAGNOSIS — H401131 Primary open-angle glaucoma, bilateral, mild stage: Secondary | ICD-10-CM | POA: Diagnosis not present

## 2021-10-14 DIAGNOSIS — K649 Unspecified hemorrhoids: Secondary | ICD-10-CM | POA: Diagnosis not present

## 2021-10-14 DIAGNOSIS — K639 Disease of intestine, unspecified: Secondary | ICD-10-CM | POA: Diagnosis not present

## 2021-10-14 DIAGNOSIS — K862 Cyst of pancreas: Secondary | ICD-10-CM | POA: Diagnosis not present

## 2021-10-14 DIAGNOSIS — R911 Solitary pulmonary nodule: Secondary | ICD-10-CM | POA: Diagnosis not present

## 2021-10-19 DIAGNOSIS — M810 Age-related osteoporosis without current pathological fracture: Secondary | ICD-10-CM | POA: Diagnosis not present

## 2021-10-19 DIAGNOSIS — R413 Other amnesia: Secondary | ICD-10-CM | POA: Diagnosis not present

## 2021-10-19 DIAGNOSIS — I10 Essential (primary) hypertension: Secondary | ICD-10-CM | POA: Diagnosis not present

## 2021-10-19 DIAGNOSIS — D72819 Decreased white blood cell count, unspecified: Secondary | ICD-10-CM | POA: Diagnosis not present

## 2021-10-22 DIAGNOSIS — Z1212 Encounter for screening for malignant neoplasm of rectum: Secondary | ICD-10-CM | POA: Diagnosis not present

## 2021-10-22 DIAGNOSIS — M810 Age-related osteoporosis without current pathological fracture: Secondary | ICD-10-CM | POA: Diagnosis not present

## 2021-10-22 DIAGNOSIS — Z0000 Encounter for general adult medical examination without abnormal findings: Secondary | ICD-10-CM | POA: Diagnosis not present

## 2021-10-22 DIAGNOSIS — I2510 Atherosclerotic heart disease of native coronary artery without angina pectoris: Secondary | ICD-10-CM | POA: Diagnosis not present

## 2021-10-22 DIAGNOSIS — D72819 Decreased white blood cell count, unspecified: Secondary | ICD-10-CM | POA: Diagnosis not present

## 2021-10-22 DIAGNOSIS — N401 Enlarged prostate with lower urinary tract symptoms: Secondary | ICD-10-CM | POA: Diagnosis not present

## 2021-10-22 DIAGNOSIS — R350 Frequency of micturition: Secondary | ICD-10-CM | POA: Diagnosis not present

## 2021-10-22 DIAGNOSIS — R9389 Abnormal findings on diagnostic imaging of other specified body structures: Secondary | ICD-10-CM | POA: Diagnosis not present

## 2021-10-22 DIAGNOSIS — I7123 Aneurysm of the descending thoracic aorta, without rupture: Secondary | ICD-10-CM | POA: Diagnosis not present

## 2021-10-22 DIAGNOSIS — R413 Other amnesia: Secondary | ICD-10-CM | POA: Diagnosis not present

## 2021-10-22 DIAGNOSIS — I10 Essential (primary) hypertension: Secondary | ICD-10-CM | POA: Diagnosis not present

## 2021-10-25 DIAGNOSIS — H34812 Central retinal vein occlusion, left eye, with macular edema: Secondary | ICD-10-CM | POA: Diagnosis not present

## 2021-10-25 DIAGNOSIS — H3582 Retinal ischemia: Secondary | ICD-10-CM | POA: Diagnosis not present

## 2021-10-25 DIAGNOSIS — H3562 Retinal hemorrhage, left eye: Secondary | ICD-10-CM | POA: Diagnosis not present

## 2021-10-25 DIAGNOSIS — Z961 Presence of intraocular lens: Secondary | ICD-10-CM | POA: Diagnosis not present

## 2021-10-25 DIAGNOSIS — H25811 Combined forms of age-related cataract, right eye: Secondary | ICD-10-CM | POA: Diagnosis not present

## 2021-10-26 ENCOUNTER — Other Ambulatory Visit: Payer: Self-pay | Admitting: Internal Medicine

## 2021-10-26 DIAGNOSIS — I7123 Aneurysm of the descending thoracic aorta, without rupture: Secondary | ICD-10-CM

## 2021-11-19 ENCOUNTER — Ambulatory Visit
Admission: RE | Admit: 2021-11-19 | Discharge: 2021-11-19 | Disposition: A | Discharge status: Home / Self Care | Payer: Medicare Other | Source: Ambulatory Visit | Attending: Internal Medicine | Admitting: Internal Medicine

## 2021-11-19 DIAGNOSIS — J929 Pleural plaque without asbestos: Secondary | ICD-10-CM | POA: Diagnosis not present

## 2021-11-19 DIAGNOSIS — J8410 Pulmonary fibrosis, unspecified: Secondary | ICD-10-CM | POA: Diagnosis not present

## 2021-11-19 DIAGNOSIS — I7123 Aneurysm of the descending thoracic aorta, without rupture: Secondary | ICD-10-CM

## 2021-11-19 DIAGNOSIS — I2510 Atherosclerotic heart disease of native coronary artery without angina pectoris: Secondary | ICD-10-CM | POA: Diagnosis not present

## 2021-11-19 DIAGNOSIS — I712 Thoracic aortic aneurysm, without rupture, unspecified: Secondary | ICD-10-CM | POA: Diagnosis not present

## 2021-11-19 MED ORDER — IOPAMIDOL (ISOVUE-370) INJECTION 76%
75.0000 mL | Freq: Once | INTRAVENOUS | Status: AC | PRN
  Administered 2021-11-19: 75 mL via INTRAVENOUS

## 2021-12-01 DIAGNOSIS — H34812 Central retinal vein occlusion, left eye, with macular edema: Secondary | ICD-10-CM | POA: Diagnosis not present

## 2021-12-15 DIAGNOSIS — H35012 Changes in retinal vascular appearance, left eye: Secondary | ICD-10-CM | POA: Diagnosis not present

## 2021-12-15 DIAGNOSIS — H35372 Puckering of macula, left eye: Secondary | ICD-10-CM | POA: Diagnosis not present

## 2021-12-15 DIAGNOSIS — H35341 Macular cyst, hole, or pseudohole, right eye: Secondary | ICD-10-CM | POA: Diagnosis not present

## 2021-12-15 DIAGNOSIS — H34812 Central retinal vein occlusion, left eye, with macular edema: Secondary | ICD-10-CM | POA: Diagnosis not present

## 2021-12-15 DIAGNOSIS — H47392 Other disorders of optic disc, left eye: Secondary | ICD-10-CM | POA: Diagnosis not present

## 2022-01-05 DIAGNOSIS — H34812 Central retinal vein occlusion, left eye, with macular edema: Secondary | ICD-10-CM | POA: Diagnosis not present

## 2022-01-19 DIAGNOSIS — H34812 Central retinal vein occlusion, left eye, with macular edema: Secondary | ICD-10-CM | POA: Diagnosis not present

## 2022-02-04 DIAGNOSIS — H34812 Central retinal vein occlusion, left eye, with macular edema: Secondary | ICD-10-CM | POA: Diagnosis not present

## 2022-02-18 DIAGNOSIS — Z23 Encounter for immunization: Secondary | ICD-10-CM | POA: Diagnosis not present

## 2022-03-08 DIAGNOSIS — H35341 Macular cyst, hole, or pseudohole, right eye: Secondary | ICD-10-CM | POA: Diagnosis not present

## 2022-03-08 DIAGNOSIS — H35373 Puckering of macula, bilateral: Secondary | ICD-10-CM | POA: Diagnosis not present

## 2022-03-08 DIAGNOSIS — H25811 Combined forms of age-related cataract, right eye: Secondary | ICD-10-CM | POA: Diagnosis not present

## 2022-03-08 DIAGNOSIS — Z961 Presence of intraocular lens: Secondary | ICD-10-CM | POA: Diagnosis not present

## 2022-03-08 DIAGNOSIS — H34812 Central retinal vein occlusion, left eye, with macular edema: Secondary | ICD-10-CM | POA: Diagnosis not present

## 2022-03-08 DIAGNOSIS — H3582 Retinal ischemia: Secondary | ICD-10-CM | POA: Diagnosis not present

## 2022-03-09 DIAGNOSIS — Z23 Encounter for immunization: Secondary | ICD-10-CM | POA: Diagnosis not present

## 2022-03-21 DIAGNOSIS — H401121 Primary open-angle glaucoma, left eye, mild stage: Secondary | ICD-10-CM | POA: Diagnosis not present

## 2022-03-21 DIAGNOSIS — H401112 Primary open-angle glaucoma, right eye, moderate stage: Secondary | ICD-10-CM | POA: Diagnosis not present

## 2022-03-22 DIAGNOSIS — H34812 Central retinal vein occlusion, left eye, with macular edema: Secondary | ICD-10-CM | POA: Diagnosis not present

## 2022-04-08 DIAGNOSIS — R310 Gross hematuria: Secondary | ICD-10-CM | POA: Diagnosis not present

## 2022-04-22 DIAGNOSIS — M810 Age-related osteoporosis without current pathological fracture: Secondary | ICD-10-CM | POA: Diagnosis not present

## 2022-04-25 DIAGNOSIS — H34812 Central retinal vein occlusion, left eye, with macular edema: Secondary | ICD-10-CM | POA: Diagnosis not present

## 2022-05-24 ENCOUNTER — Telehealth: Payer: Self-pay

## 2022-05-24 NOTE — Patient Outreach (Signed)
  Care Coordination   05/24/2022 Name: Jordan Lang MRN: [id number] DOB: [birth year]   Care Coordination Outreach Attempts:  An unsuccessful telephone outreach was attempted today to offer the patient information about available care coordination services as a benefit of their health plan.   Follow Up Plan:  Additional outreach attempts will be made to offer the patient care coordination information and services.   Encounter Outcome:  No Answer   Care Coordination Interventions:  No, not indicated    Jone Baseman, RN, MSN [facility] Care Management Coordinator Direct Line [phone]

## 2022-05-25 DIAGNOSIS — R159 Full incontinence of feces: Secondary | ICD-10-CM | POA: Diagnosis not present

## 2022-05-25 DIAGNOSIS — H34812 Central retinal vein occlusion, left eye, with macular edema: Secondary | ICD-10-CM | POA: Diagnosis not present

## 2022-05-25 DIAGNOSIS — F39 Unspecified mood [affective] disorder: Secondary | ICD-10-CM | POA: Diagnosis not present

## 2022-05-25 DIAGNOSIS — R319 Hematuria, unspecified: Secondary | ICD-10-CM | POA: Diagnosis not present

## 2022-05-25 DIAGNOSIS — R413 Other amnesia: Secondary | ICD-10-CM | POA: Diagnosis not present

## 2022-05-25 DIAGNOSIS — Z23 Encounter for immunization: Secondary | ICD-10-CM | POA: Diagnosis not present

## 2022-05-27 DIAGNOSIS — R310 Gross hematuria: Secondary | ICD-10-CM | POA: Diagnosis not present

## 2022-06-06 ENCOUNTER — Telehealth: Payer: Self-pay

## 2022-06-06 NOTE — Patient Outreach (Signed)
  Care Coordination   Initial Visit Note   06/06/2022 Name: Jordan Lang MRN: [id number] DOB: 22-Apr-1941  Jordan Lang is a 82 y.o. year old male who sees Deland Pretty, MD for primary care. I spoke with  Lacey Jensen by phone today.  What matters to the patients health and wellness today?  none    Goals Addressed             This Visit's Progress    COMPLETED: Care Coordination Activities- No follow up required       Care Coordination Interventions: Advised patient to Annual Wellness exam. Discussed [facility] services and support. Assessed SDOH. Advised to discuss with primary care physician if services needed in the future.        SDOH assessments and interventions completed:  Yes  SDOH Interventions Today    Flowsheet Row Most Recent Value  SDOH Interventions   Food Insecurity Interventions Intervention Not Indicated  Housing Interventions Intervention Not Indicated        Care Coordination Interventions:  Yes, provided   Follow up plan: No further intervention required.   Encounter Outcome:  Pt. Visit Completed   Jone Baseman, RN, MSN [place] [facility] Care Management Coordinator Direct Line [phone]

## 2022-06-06 NOTE — Patient Instructions (Signed)
Visit Information  Thank you for taking time to visit with me today. Please don't hesitate to contact me if I can be of assistance to you.   Following are the goals we discussed today:   Goals Addressed             This Visit's Progress    COMPLETED: Care Coordination Activities-No follow up required       Care Coordination Interventions: Advised patient to Annual Wellness exam. Discussed [facility] services and support. Assessed SDOH. Advised to discuss with primary care physician if services needed in the future.         If you are experiencing a Mental Health or Behavioral Health Crisis or need someone to talk to, please call the Suicide and Crisis Lifeline: 988   Patient verbalizes understanding of instructions and care plan provided today and agrees to view in MyChart. Active MyChart status and patient understanding of how to access instructions and care plan via MyChart confirmed with patient.     No further follow up required: decline  Saloma Cadena J Westyn Keatley, RN, MSN [facility] Care Management Care Management Coordinator Direct Line [phone]     

## 2022-06-08 DIAGNOSIS — R310 Gross hematuria: Secondary | ICD-10-CM | POA: Diagnosis not present

## 2022-06-08 DIAGNOSIS — N132 Hydronephrosis with renal and ureteral calculous obstruction: Secondary | ICD-10-CM | POA: Diagnosis not present

## 2022-06-15 ENCOUNTER — Other Ambulatory Visit: Payer: Self-pay | Admitting: Urology

## 2022-06-21 ENCOUNTER — Encounter (HOSPITAL_BASED_OUTPATIENT_CLINIC_OR_DEPARTMENT_OTHER): Payer: Self-pay | Admitting: Urology

## 2022-06-21 NOTE — Progress Notes (Signed)
Spoke w/ via phone for pre-op interview---[facility] needs dos----EKG               Lab results------ COVID test -----patient states asymptomatic no test needed Arrive at -------0930 NPO after MN NO Solid Food.  Clear liquids from MN until---0830 Med rec completed Medications to take morning of surgery -----Norvasc and Crestor Diabetic medication ----- Patient instructed no nail polish to be worn day of surgery Patient instructed to bring photo id and insurance card day of surgery Patient aware to have Driver (ride ) / caregiver Jordan Lang   for 24 hours after surgery  Patient Special Instructions ----- Pre-Op special Istructions ----- Patient verbalized understanding of instructions that were given at this phone interview. Patient denies shortness of breath, chest pain, fever, cough at this phone interview.

## 2022-06-23 DIAGNOSIS — U071 COVID-19: Secondary | ICD-10-CM

## 2022-06-23 HISTORY — DX: COVID-19: U07.1

## 2022-06-30 DIAGNOSIS — I10 Essential (primary) hypertension: Secondary | ICD-10-CM

## 2022-07-06 ENCOUNTER — Encounter (HOSPITAL_BASED_OUTPATIENT_CLINIC_OR_DEPARTMENT_OTHER): Payer: Self-pay | Admitting: Urology

## 2022-07-06 NOTE — Progress Notes (Signed)
Spoke w/ via phone for pre-op interview---[facility] needs dos---- EKG              Lab results------ COVID test -----patient states asymptomatic no test needed Arrive at -------1115 NPO after MN NO Solid Food.  Clear liquids from MN until---1015 Med rec completed Medications to take morning of surgery -----Norvasc and Crestor Diabetic medication ----- Patient instructed no nail polish to be worn day of surgery Patient instructed to bring photo id and insurance card day of surgery Patient aware to have Driver (ride ) / caregiver  Son- Brazen Marsili  for 24 hours after surgery  Patient Special Instructions ----- Pre-Op special Istructions ----- Patient verbalized understanding of instructions that were given at this phone interview. Patient denies shortness of breath, chest pain, fever, cough at this phone interview.

## 2022-07-11 DIAGNOSIS — N201 Calculus of ureter: Secondary | ICD-10-CM | POA: Diagnosis not present

## 2022-07-11 DIAGNOSIS — H34812 Central retinal vein occlusion, left eye, with macular edema: Secondary | ICD-10-CM | POA: Diagnosis not present

## 2022-07-11 DIAGNOSIS — N210 Calculus in bladder: Secondary | ICD-10-CM | POA: Diagnosis not present

## 2022-07-11 DIAGNOSIS — R310 Gross hematuria: Secondary | ICD-10-CM | POA: Diagnosis not present

## 2022-07-13 NOTE — H&P (Signed)
H&P  Chief Complaint: Bladder, ureteral stones  History of Present Illness: 82 yo male presents for endoscopic mgmt of symptomatic Rt distal ureteral and bladder calculi.  Past Medical History:  Diagnosis Date   COVID-19 06/2022   Glaucoma    Right eye   History of kidney stones    Hypercholesteremia    Hypertension    Renal disorder    kidney stones    Past Surgical History:  Procedure Laterality Date   CATARACT EXTRACTION Left 2022   HERNIA REPAIR      Home Medications:  Allergies as of 07/13/2022       Reactions   Flomax [tamsulosin Hcl] Other (See Comments)   Dizziness        Medication List      Notice   Cannot display discharge medications because the patient has not yet been admitted.     Allergies:  Allergies  Allergen Reactions   Flomax [Tamsulosin Hcl] Other (See Comments)    Dizziness    History reviewed. No pertinent family history.  Social History:  reports that he has never smoked. He has never used smokeless tobacco. He reports current alcohol use. He reports that he does not use drugs.  ROS: A complete review of systems was performed.  All systems are negative except for pertinent findings as noted.  Physical Exam:  Vital signs in last 24 hours: Ht 5' 7.5" (1.715 m)   Wt 61.2 kg   BMI 20.83 kg/m  Constitutional:  Alert and oriented, No acute distress Cardiovascular: Regular rate  Respiratory: Normal respiratory effort Neurologic: Grossly intact, no focal deficits Psychiatric: Normal mood and affect  I have reviewed prior pt notes  I have reviewed urinalysis results  I have independently reviewed prior imaging     Impression/Assessment:  Rt ureteral (6 mm) and bladder (17 mm) calculi  Plan:  Cysto, Rt RGP, Rt URS, HLL, Rt ureteral stent, cystolithalopaxy of 17 mm bladder stone

## 2022-07-14 ENCOUNTER — Ambulatory Visit (HOSPITAL_BASED_OUTPATIENT_CLINIC_OR_DEPARTMENT_OTHER): Payer: Medicare Other | Admitting: Anesthesiology

## 2022-07-14 ENCOUNTER — Encounter (HOSPITAL_BASED_OUTPATIENT_CLINIC_OR_DEPARTMENT_OTHER)
Admission: RE | Disposition: A | Discharge status: Home / Self Care | Payer: Self-pay | Source: Home / Self Care | Attending: Urology

## 2022-07-14 ENCOUNTER — Ambulatory Visit (HOSPITAL_BASED_OUTPATIENT_CLINIC_OR_DEPARTMENT_OTHER)
Admission: RE | Admit: 2022-07-14 | Discharge: 2022-07-14 | Disposition: A | Discharge status: Home / Self Care | Payer: Medicare Other | Attending: Urology | Admitting: Urology

## 2022-07-14 ENCOUNTER — Other Ambulatory Visit: Payer: Self-pay

## 2022-07-14 ENCOUNTER — Encounter (HOSPITAL_BASED_OUTPATIENT_CLINIC_OR_DEPARTMENT_OTHER): Payer: Self-pay | Admitting: Urology

## 2022-07-14 DIAGNOSIS — Z87442 Personal history of urinary calculi: Secondary | ICD-10-CM | POA: Diagnosis not present

## 2022-07-14 DIAGNOSIS — I10 Essential (primary) hypertension: Secondary | ICD-10-CM | POA: Diagnosis not present

## 2022-07-14 DIAGNOSIS — Z79899 Other long term (current) drug therapy: Secondary | ICD-10-CM | POA: Diagnosis not present

## 2022-07-14 DIAGNOSIS — N400 Enlarged prostate without lower urinary tract symptoms: Secondary | ICD-10-CM | POA: Diagnosis not present

## 2022-07-14 DIAGNOSIS — N210 Calculus in bladder: Secondary | ICD-10-CM | POA: Diagnosis not present

## 2022-07-14 DIAGNOSIS — N201 Calculus of ureter: Secondary | ICD-10-CM | POA: Insufficient documentation

## 2022-07-14 DIAGNOSIS — E7800 Pure hypercholesterolemia, unspecified: Secondary | ICD-10-CM | POA: Diagnosis not present

## 2022-07-14 HISTORY — DX: Personal history of urinary calculi: Z87.442

## 2022-07-14 HISTORY — DX: Pure hypercholesterolemia, unspecified: E78.00

## 2022-07-14 HISTORY — PX: HOLMIUM LASER APPLICATION: SHX5852

## 2022-07-14 HISTORY — DX: Unspecified glaucoma: H40.9

## 2022-07-14 HISTORY — PX: CYSTOSCOPY WITH RETROGRADE PYELOGRAM, URETEROSCOPY AND STENT PLACEMENT: SHX5789

## 2022-07-14 HISTORY — PX: CYSTOSCOPY WITH LITHOLAPAXY: SHX1425

## 2022-07-14 HISTORY — DX: Essential (primary) hypertension: I10

## 2022-07-14 SURGERY — CYSTOURETEROSCOPY, WITH RETROGRADE PYELOGRAM AND STENT INSERTION
Anesthesia: General | Site: Renal | Laterality: Right

## 2022-07-14 MED ORDER — DEXAMETHASONE SODIUM PHOSPHATE 10 MG/ML IJ SOLN
INTRAMUSCULAR | Status: AC
  Filled 2022-07-14: qty 1

## 2022-07-14 MED ORDER — CEFAZOLIN SODIUM-DEXTROSE 2-4 GM/100ML-% IV SOLN
2.0000 g | INTRAVENOUS | Status: AC
  Administered 2022-07-14: 2 g via INTRAVENOUS

## 2022-07-14 MED ORDER — OXYCODONE HCL 5 MG/5ML PO SOLN: 5.0000 mg | Freq: Once | ORAL | Status: DC | PRN

## 2022-07-14 MED ORDER — ONDANSETRON HCL 4 MG/2ML IJ SOLN: 4.0000 mg | Freq: Once | INTRAMUSCULAR | Status: DC | PRN

## 2022-07-14 MED ORDER — CEFAZOLIN SODIUM-DEXTROSE 2-4 GM/100ML-% IV SOLN
INTRAVENOUS | Status: AC
  Filled 2022-07-14: qty 100

## 2022-07-14 MED ORDER — LIDOCAINE HCL (CARDIAC) PF 100 MG/5ML IV SOSY
PREFILLED_SYRINGE | INTRAVENOUS | Status: DC | PRN
  Administered 2022-07-14: 60 mg via INTRAVENOUS

## 2022-07-14 MED ORDER — LACTATED RINGERS IV SOLN: INTRAVENOUS | Status: DC

## 2022-07-14 MED ORDER — ACETAMINOPHEN 10 MG/ML IV SOLN: 1000.0000 mg | Freq: Once | INTRAVENOUS | Status: DC | PRN

## 2022-07-14 MED ORDER — FENTANYL CITRATE (PF) 100 MCG/2ML IJ SOLN: 25.0000 ug | INTRAMUSCULAR | Status: DC | PRN

## 2022-07-14 MED ORDER — OXYCODONE HCL 5 MG PO TABS: 5.0000 mg | ORAL_TABLET | Freq: Once | ORAL | Status: DC | PRN

## 2022-07-14 MED ORDER — DEXAMETHASONE SODIUM PHOSPHATE 4 MG/ML IJ SOLN
INTRAMUSCULAR | Status: DC | PRN
  Administered 2022-07-14: 6 mg via INTRAVENOUS

## 2022-07-14 MED ORDER — IOHEXOL 300 MG/ML  SOLN
INTRAMUSCULAR | Status: DC | PRN
  Administered 2022-07-14: 10 mL via URETHRAL

## 2022-07-14 MED ORDER — CEPHALEXIN 500 MG PO CAPS: 500.0000 mg | ORAL_CAPSULE | Freq: Two times a day (BID) | ORAL | 0 refills | Status: AC

## 2022-07-14 MED ORDER — FENTANYL CITRATE (PF) 100 MCG/2ML IJ SOLN
INTRAMUSCULAR | Status: DC | PRN
  Administered 2022-07-14 (×2): 50 ug via INTRAVENOUS

## 2022-07-14 MED ORDER — EPHEDRINE SULFATE-NACL 50-0.9 MG/10ML-% IV SOSY
PREFILLED_SYRINGE | INTRAVENOUS | Status: DC | PRN
  Administered 2022-07-14 (×2): 5 mg via INTRAVENOUS

## 2022-07-14 MED ORDER — ONDANSETRON HCL 4 MG/2ML IJ SOLN
INTRAMUSCULAR | Status: AC
  Filled 2022-07-14: qty 2

## 2022-07-14 MED ORDER — ONDANSETRON HCL 4 MG/2ML IJ SOLN
INTRAMUSCULAR | Status: DC | PRN
  Administered 2022-07-14: 4 mg via INTRAVENOUS

## 2022-07-14 MED ORDER — PROPOFOL 10 MG/ML IV BOLUS
INTRAVENOUS | Status: DC | PRN
  Administered 2022-07-14: 130 mg via INTRAVENOUS

## 2022-07-14 MED ORDER — LIDOCAINE HCL (PF) 2 % IJ SOLN
INTRAMUSCULAR | Status: AC
  Filled 2022-07-14: qty 5

## 2022-07-14 MED ORDER — GLYCOPYRROLATE PF 0.2 MG/ML IJ SOSY
PREFILLED_SYRINGE | INTRAMUSCULAR | Status: AC
  Filled 2022-07-14: qty 1

## 2022-07-14 MED ORDER — FENTANYL CITRATE (PF) 100 MCG/2ML IJ SOLN
INTRAMUSCULAR | Status: AC
  Filled 2022-07-14: qty 2

## 2022-07-14 MED ORDER — GLYCOPYRROLATE 0.2 MG/ML IJ SOLN
INTRAMUSCULAR | Status: DC | PRN
  Administered 2022-07-14: .2 mg via INTRAVENOUS

## 2022-07-14 SURGICAL SUPPLY — 38 items
BAG DRAIN URO-CYSTO SKYTR STRL (DRAIN) ×3 IMPLANT
BAG DRN RND TRDRP ANRFLXCHMBR (UROLOGICAL SUPPLIES) ×3
BAG DRN UROCATH (DRAIN) ×3
BAG URINE DRAIN 2000ML AR STRL (UROLOGICAL SUPPLIES) IMPLANT
BASKET ZERO TIP NITINOL 2.4FR (BASKET) IMPLANT
BLANKET WARM UPPER BOD BAIR (MISCELLANEOUS) ×3 IMPLANT
BSKT STON RTRVL ZERO TP 2.4FR (BASKET) ×3
CATH FOLEY 2WAY SLVR  5CC 18FR (CATHETERS) ×3
CATH FOLEY 2WAY SLVR 5CC 18FR (CATHETERS) IMPLANT
CATH URETL OPEN END 6FR 70 (CATHETERS) ×3 IMPLANT
CLOTH BEACON ORANGE TIMEOUT ST (SAFETY) ×6 IMPLANT
COVER DOME SNAP 22 D (MISCELLANEOUS) ×3 IMPLANT
DRSG TEGADERM 2-3/8X2-3/4 SM (GAUZE/BANDAGES/DRESSINGS) IMPLANT
ELECT REM PT RETURN 9FT ADLT (ELECTROSURGICAL)
ELECTRODE REM PT RTRN 9FT ADLT (ELECTROSURGICAL) IMPLANT
GLOVE BIO SURGEON STRL SZ8 (GLOVE) ×3 IMPLANT
GOWN STRL REUS W/TWL XL LVL3 (GOWN DISPOSABLE) ×3 IMPLANT
GUIDEWIRE ANG ZIPWIRE 038X150 (WIRE) IMPLANT
GUIDEWIRE STR DUAL SENSOR (WIRE) IMPLANT
HOLDER FOLEY CATH W/STRAP (MISCELLANEOUS) IMPLANT
IV NS IRRIG 3000ML ARTHROMATIC (IV SOLUTION) ×6 IMPLANT
KIT TURNOVER CYSTO (KITS) ×3 IMPLANT
LASER FIB FLEXIVA PULSE ID 365 (Laser) IMPLANT
LASER FIB FLEXIVA PULSE ID 550 (Laser) IMPLANT
LASER FIB FLEXIVA PULSE ID 910 (Laser) IMPLANT
MANIFOLD NEPTUNE II (INSTRUMENTS) ×3 IMPLANT
NS IRRIG 500ML POUR BTL (IV SOLUTION) ×3 IMPLANT
PACK CYSTO (CUSTOM PROCEDURE TRAY) ×3 IMPLANT
SHEATH NAVIGATOR HD 11/13X36 (SHEATH) IMPLANT
SHEATH URETERAL 12FRX28CM (UROLOGICAL SUPPLIES) IMPLANT
SLEEVE SCD COMPRESS KNEE MED (STOCKING) ×3 IMPLANT
STENT URET 6FRX26 CONTOUR (STENTS) IMPLANT
TRACTIP FLEXIVA PULS ID 200XHI (Laser) IMPLANT
TRACTIP FLEXIVA PULSE ID 200 (Laser)
TUBE CONNECTING 12X1/4 (SUCTIONS) ×3 IMPLANT
TUBING UROLOGY SET (TUBING) IMPLANT
WATER STERILE IRR 3000ML UROMA (IV SOLUTION) IMPLANT
WATER STERILE IRR 500ML POUR (IV SOLUTION) IMPLANT

## 2022-07-14 NOTE — Op Note (Signed)
Preoperative diagnosis: Right distal ureteral stone, 16 mm bladder stone  Postoperative diagnosis: 2 right distal ureteral stones, 16 mm bladder stone  Principal procedure: Cystoscopy, right retrograde pyelogram, fluoroscopic interpretation, right ureteroscopy with holmium laser and extraction of right ureteral stones, placement of 6 French by 26 cm contour double-J stent with tether, cystolitholapaxy of 16 mm bladder stone  Surgeon: Seini Lannom  Anesthesia: General with LMA  Complications: None  Estimated blood loss: Less than 5 mL  Specimen: Stone fragments  Indications: 82 year old male with recent history of gross hematuria.  Evaluation included CT scan revealing a right ureteral stone without significant hydronephrosis.  This was approximately 2 cm above the right UVJ.  Additionally he had a large bladder stone, Jek stone in nature.  He presents at this time for anesthetic procedure including ureteroscopic management of his right stone and whole holmium laser lithotripsy of his bladder calculus.  I discussed the procedure, risk, complications and expected outcome with the patient.  He understands and desires to proceed.  Findings: Urethra was normal.  Prostate moderately obstructive with bilobar hypertrophy.  Minimal trabeculations of the bladder.  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  No urothelial abnormalities were noted.  There was a large jack stone present in the midline layering posteriorly.  Ureteral orifices were normal in location and configuration.  Fluoroscopic evaluation of the retrograde study revealed a significant filling defect in the right distal ureter.  There was no proximal hydroureteronephrosis.  No filling defects were seen in the pyelocalyceal system which was of normal caliber.  Description of procedure: The patient was properly identified and marked in the holding area.  He is taken to the operating room where general anesthetic was  administered with the LMA.  He is placed in the dorsolithotomy position.  Genitalia and perineum were prepped, draped, proper timeout performed.  21 French panendoscope advanced under direct vision into the bladder.  Above-mentioned findings noted.  Retrograde study performed using a 6 [place] open-ended catheter and Omnipaque.  Filling defect in the right distal ureter but no proximal hydroureteronephrosis.  Sensor tip guidewire was advanced through the open-ended catheter until a curl was seen in the upper pole calyx.  The cystoscope and the open-ended catheter were removed.  Distal ureter was dilated with a 12/14 short ureteral access catheter.  This was then removed.  Semirigid dual-lumen short ureteroscope was advanced through the urethra and up into the ureter where the stone was encountered.  It was too large to easily extract whole.  I then used a 15 m fiber to fragment the large stone in a much smaller fragments which were easily extracted into the bladder using a 0 tip basket.  There was a smaller stone more proximal.  I could then grasped this and extracted intact.  The scope was then run up to the UPJ.  No further ureteral stones were noted.  The scope was then removed.  Guidewire was left intact during the cystolitholapaxy.  I then switched to the cystoscope.  1000 m fiber was utilized to deliver laser energy to the large jack stone.  This was fragmented into multiple smaller fragments which were then rinsed from the bladder through the cystoscope.  Careful attention was made to removing all significant stone matter.  A few fragments had gone into the diverticulum and when these were easily rinsed out as well.  Careful inspection of the bladder at this point revealed no further stone matter.  Some dust was noted on the urothelium.  I tried to rinse this out using a [place] syringe.  Another inspection of the bladder revealed no further stones.  The scope was then removed.  I then backloaded the  guidewire through the cystoscope, and passed a 26 cm a 6 French contour double-J stent.  Tether was left on.  Once adequately positioned, the guidewire was removed and good proximal and distal curls were seen using fluoroscopy and cystoscopy, respectively.  The thread was left intact.  The scope was removed, and an 28 French Foley catheter was then placed.  Balloon filled with 10 cc of water and hooked to dependent drainage.  The tether was then trimmed and taped to the patient's penis.  At this point the patient was awakened and taken to the PACU in stable condition, having tolerated the procedure well.

## 2022-07-14 NOTE — Progress Notes (Signed)
Instructed pt on the care of the foley catheter, removal of it in the am and removal of the stent on Monday am.  Dr. Diona Fanti in to evaluate pt.  Son and patient voiced understanding of directions.

## 2022-07-14 NOTE — Interval H&P Note (Signed)
History and Physical Interval Note:  07/14/2022 12:45 PM  Jordan Lang  has presented today for surgery, with the diagnosis of URETERAL AND BLADDER STONES.  The various methods of treatment have been discussed with the patient and family. After consideration of risks, benefits and other options for treatment, the patient has consented to  Procedure(s): CYSTOSCOPY WITH RETROGRADE PYELOGRAM, URETEROSCOPY AND STENT PLACEMENT (Right) HOLMIUM LASER APPLICATION (Right) CYSTOSCOPY WITH LITHOLAPAXY (N/A) as a surgical intervention.  The patient's history has been reviewed, patient examined, no change in status, stable for surgery.  I have reviewed the patient's chart and labs.  Questions were answered to the patient's satisfaction.     Lillette Boxer Dalten Ambrosino

## 2022-07-14 NOTE — Anesthesia Procedure Notes (Signed)
Procedure Name: LMA Insertion Date/Time: 07/14/2022 1:03 PM  Performed by: Lieutenant Diego, CRNAPre-anesthesia Checklist: Patient identified, Emergency Drugs available, Suction available and Patient being monitored Patient Re-evaluated:Patient Re-evaluated prior to induction Oxygen Delivery Method: Circle system utilized Preoxygenation: Pre-oxygenation with 100% oxygen Induction Type: IV induction Ventilation: Mask ventilation without difficulty LMA: LMA inserted LMA Size: 4.0 Number of attempts: 1 Placement Confirmation: positive ETCO2 and breath sounds checked- equal and bilateral Tube secured with: Tape Dental Injury: Teeth and Oropharynx as per pre-operative assessment

## 2022-07-14 NOTE — Transfer of Care (Signed)
Immediate Anesthesia Transfer of Care Note  Patient: Jordan Lang  Procedure(s) Performed: CYSTOSCOPY WITH RETROGRADE PYELOGRAM, URETEROSCOPY AND STENT PLACEMENT (Right: Renal) HOLMIUM LASER APPLICATION (Right: Pelvis) CYSTOSCOPY WITH LITHOLAPAXY (Bladder)  Patient Location: PACU  Anesthesia Type:General  Level of Consciousness: drowsy  Airway & Oxygen Therapy: Patient Spontanous Breathing and Patient connected to nasal cannula oxygen  Post-op Assessment: Report given to RN and Post -op Vital signs reviewed and stable  Post vital signs: Reviewed and stable  Last Vitals:  Vitals Value Taken Time  BP 118/66 07/14/22 1410  Temp    Pulse 58 07/14/22 1411  Resp 10 07/14/22 1411  SpO2 100 % 07/14/22 1411  Vitals shown include unvalidated device data.  Last Pain:  Vitals:   07/14/22 1120  TempSrc: Oral  PainSc: 0-No pain      Patients Stated Pain Goal: 5 ([id number])  Complications: No notable events documented.

## 2022-07-14 NOTE — Discharge Instructions (Addendum)
You may see some blood in the urine and may have some burning with urination for 48-72 hours. You also may notice that you have to urinate more frequently or urgently after your procedure which is normal.  You should call should you develop an inability urinate, fever > 101, persistent nausea and vomiting that prevents you from eating or drinking to stay hydrated.  If you have a stent, you will likely urinate more frequently and urgently until the stent is removed and you may experience some discomfort/pain in the lower abdomen and flank especially when urinating. You may take pain medication prescribed to you if needed for pain. You may also intermittently have blood in the urine until the stent is removed.  You can remove the stent by pulling the thread on your penis on Monday. If you have a catheter, you will be taught how to take care of the catheter by the nursing staff prior to discharge from the hospital.  You may periodically feel a strong urge to void with the catheter in place.  This is a bladder spasm and most often can occur when having a bowel movement or moving around. It is typically self-limited and usually will stop after a few minutes.  You may use some Vaseline or Neosporin around the tip of the catheter to reduce friction at the tip of the penis. You may also see some blood in the urine.  A very small amount of blood can make the urine look quite red.  As long as the catheter is draining well, there usually is not a problem.  However, if the catheter is not draining well and is bloody, you should call the office [phone]) to notify us.  It is okay to remove the catheter as instructed by the nurses on Friday morning.  Take care not to pull the thread at that time.  [facility] [phone] Post Ureteroscopy With or Without Stent Instructions  Definitions:  Ureter: The duct that transports urine from the kidney to the bladder. Stent:   A plastic hollow tube that is  placed into the ureter, from the kidney to the bladder to prevent the ureter from swelling shut.  GENERAL INSTRUCTIONS:  Despite the fact that no skin incisions were used, the area around the ureter and bladder is raw and irritated. The stent is a foreign body which will further irritate the bladder wall. This irritation is manifested by increased frequency of urination, both day and night, and by an increase in the urge to urinate. In some, the urge to urinate is present almost always. Sometimes the urge is strong enough that you may not be able to stop yourself from urinating. The only real cure is to remove the stent and then give time for the bladder wall to heal which can't be done until the danger of the ureter swelling shut has passed, which varies.  You may see some blood in your urine while the stent is in place and a few days afterwards. Do not be alarmed, even if the urine was clear for a while. Get off your feet and drink lots of fluids until clearing occurs. If you start to pass clots or don't improve, call us.  DIET: You may return to your normal diet immediately. Because of the raw surface of your bladder, alcohol, spicy foods, acid type foods and drinks with caffeine may cause irritation or frequency and should be used in moderation. To keep your urine flowing freely and to avoid constipation,  drink plenty of fluids during the day ( 8-10 glasses ). Tip: Avoid cranberry juice because it is very acidic.  ACTIVITY: Your physical activity doesn't need to be restricted. However, if you are very active, you may see some blood in your urine. We suggest that you reduce your activity under these circumstances until the bleeding has stopped.  BOWELS: It is important to keep your bowels regular during the postoperative period. Straining with bowel movements can cause bleeding. A bowel movement every other day is reasonable. Use a mild laxative if needed, such as Milk of Magnesia 2-3 tablespoons,  or 2 Dulcolax tablets. Call if you continue to have problems. If you have been taking narcotics for pain, before, during or after your surgery, you may be constipated. Take a laxative if necessary.   MEDICATION: You should resume your pre-surgery medications unless told not to. In addition you will often be given an antibiotic to prevent infection. These should be taken as prescribed until the bottles are finished unless you are having an unusual reaction to one of the drugs.  PROBLEMS YOU SHOULD REPORT TO [place]: Fevers over 100.5 Fahrenheit. Heavy bleeding, or clots ( See above notes about blood in urine ). Inability to urinate. Drug reactions ( hives, rash, nausea, vomiting, diarrhea ). Severe burning or pain with urination that is not improving.  FOLLOW-UP: You will need a follow-up appointment to monitor your progress. Call for this appointment at the number listed above. Usually the first appointment will be about three to fourteen days after your surgery.     Post Anesthesia Home Care Instructions  Activity: Get plenty of rest for the remainder of the day. A responsible individual must stay with you for 24 hours following the procedure.  For the next 24 hours, DO NOT: -Drive a car -Paediatric nurse -Drink alcoholic beverages -Take any medication unless instructed by your physician -Make any legal decisions or sign important papers.  Meals: Start with liquid foods such as gelatin or soup. Progress to regular foods as tolerated. Avoid greasy, spicy, heavy foods. If nausea and/or vomiting occur, drink only clear liquids until the nausea and/or vomiting subsides. Call your physician if vomiting continues.  Special Instructions/Symptoms: Your throat may feel dry or sore from the anesthesia or the breathing tube placed in your throat during surgery. If this causes discomfort, gargle with warm salt water. The discomfort should disappear within 24 hours.

## 2022-07-14 NOTE — Anesthesia Postprocedure Evaluation (Signed)
Anesthesia Post Note  Patient: Jordan Lang  Procedure(s) Performed: CYSTOSCOPY WITH RETROGRADE PYELOGRAM, URETEROSCOPY AND STENT PLACEMENT (Right: Renal) HOLMIUM LASER APPLICATION (Right: Pelvis) CYSTOSCOPY WITH LITHOLAPAXY (Bladder)     Patient location during evaluation: PACU Anesthesia Type: General Level of consciousness: awake and alert Pain management: pain level controlled Vital Signs Assessment: post-procedure vital signs reviewed and stable Respiratory status: spontaneous breathing, nonlabored ventilation, respiratory function stable and patient connected to nasal cannula oxygen Cardiovascular status: blood pressure returned to baseline and stable Postop Assessment: no apparent nausea or vomiting Anesthetic complications: no  No notable events documented.  Last Vitals:  Vitals:   07/14/22 1410 07/14/22 1415  BP: 118/66 114/62  Pulse: (!) 57 63  Resp: 12 11  Temp: (!) 36.4 C   SpO2: 100% 100%    Last Pain:  Vitals:   07/14/22 1410  TempSrc:   PainSc: Asleep                 Rilen Shukla S

## 2022-07-14 NOTE — Anesthesia Preprocedure Evaluation (Addendum)
Anesthesia Evaluation  Patient identified by MRN, date of birth, ID band Patient awake    Reviewed: Allergy & Precautions, H&P , NPO status , Patient's Chart, lab work & pertinent test results  Airway Mallampati: II  TM Distance: >3 FB Neck ROM: Full    Dental no notable dental hx.    Pulmonary neg pulmonary ROS   Pulmonary exam normal breath sounds clear to auscultation       Cardiovascular hypertension, Pt. on medications Normal cardiovascular exam Rhythm:Regular Rate:Normal     Neuro/Psych negative neurological ROS  negative psych ROS   GI/Hepatic negative GI ROS, Neg liver ROS,,,  Endo/Other  negative endocrine ROS    Renal/GU negative Renal ROS  negative genitourinary   Musculoskeletal negative musculoskeletal ROS (+)    Abdominal   Peds negative pediatric ROS (+)  Hematology negative hematology ROS (+)   Anesthesia Other Findings   Reproductive/Obstetrics negative OB ROS                             Anesthesia Physical Anesthesia Plan  ASA: 2  Anesthesia Plan: General   Post-op Pain Management: Minimal or no pain anticipated   Induction: Intravenous  PONV Risk Score and Plan: 2 and Ondansetron, Dexamethasone and Treatment may vary due to age or medical condition  Airway Management Planned: LMA  Additional Equipment:   Intra-op Plan:   Post-operative Plan: Extubation in OR  Informed Consent: I have reviewed the patients History and Physical, chart, labs and discussed the procedure including the risks, benefits and alternatives for the proposed anesthesia with the patient or authorized representative who has indicated his/her understanding and acceptance.     Dental advisory given  Plan Discussed with: CRNA and Surgeon  Anesthesia Plan Comments:        Anesthesia Quick Evaluation

## 2022-07-15 ENCOUNTER — Encounter (HOSPITAL_BASED_OUTPATIENT_CLINIC_OR_DEPARTMENT_OTHER): Payer: Self-pay | Admitting: Urology

## 2022-07-29 DIAGNOSIS — N201 Calculus of ureter: Secondary | ICD-10-CM | POA: Diagnosis not present

## 2022-08-05 DIAGNOSIS — N200 Calculus of kidney: Secondary | ICD-10-CM | POA: Diagnosis not present

## 2022-08-10 DIAGNOSIS — H34812 Central retinal vein occlusion, left eye, with macular edema: Secondary | ICD-10-CM | POA: Diagnosis not present

## 2022-09-14 DIAGNOSIS — H34812 Central retinal vein occlusion, left eye, with macular edema: Secondary | ICD-10-CM | POA: Diagnosis not present

## 2022-09-28 DIAGNOSIS — H401132 Primary open-angle glaucoma, bilateral, moderate stage: Secondary | ICD-10-CM | POA: Diagnosis not present

## 2022-09-28 DIAGNOSIS — H2511 Age-related nuclear cataract, right eye: Secondary | ICD-10-CM | POA: Diagnosis not present

## 2022-09-28 DIAGNOSIS — H1789 Other corneal scars and opacities: Secondary | ICD-10-CM | POA: Diagnosis not present

## 2022-09-28 DIAGNOSIS — H52203 Unspecified astigmatism, bilateral: Secondary | ICD-10-CM | POA: Diagnosis not present

## 2022-10-14 DIAGNOSIS — H34812 Central retinal vein occlusion, left eye, with macular edema: Secondary | ICD-10-CM | POA: Diagnosis not present

## 2022-10-24 DIAGNOSIS — M810 Age-related osteoporosis without current pathological fracture: Secondary | ICD-10-CM | POA: Diagnosis not present

## 2022-10-27 DIAGNOSIS — E785 Hyperlipidemia, unspecified: Secondary | ICD-10-CM | POA: Diagnosis not present

## 2022-11-02 DIAGNOSIS — I2510 Atherosclerotic heart disease of native coronary artery without angina pectoris: Secondary | ICD-10-CM | POA: Diagnosis not present

## 2022-11-02 DIAGNOSIS — R9431 Abnormal electrocardiogram [ECG] [EKG]: Secondary | ICD-10-CM | POA: Diagnosis not present

## 2022-11-02 DIAGNOSIS — Z23 Encounter for immunization: Secondary | ICD-10-CM | POA: Diagnosis not present

## 2022-11-02 DIAGNOSIS — I10 Essential (primary) hypertension: Secondary | ICD-10-CM | POA: Diagnosis not present

## 2022-11-02 DIAGNOSIS — E785 Hyperlipidemia, unspecified: Secondary | ICD-10-CM | POA: Diagnosis not present

## 2022-11-02 DIAGNOSIS — K862 Cyst of pancreas: Secondary | ICD-10-CM | POA: Diagnosis not present

## 2022-11-02 DIAGNOSIS — M810 Age-related osteoporosis without current pathological fracture: Secondary | ICD-10-CM | POA: Diagnosis not present

## 2022-11-02 DIAGNOSIS — M8580 Other specified disorders of bone density and structure, unspecified site: Secondary | ICD-10-CM | POA: Diagnosis not present

## 2022-11-02 DIAGNOSIS — N400 Enlarged prostate without lower urinary tract symptoms: Secondary | ICD-10-CM | POA: Diagnosis not present

## 2022-11-02 DIAGNOSIS — Z0000 Encounter for general adult medical examination without abnormal findings: Secondary | ICD-10-CM | POA: Diagnosis not present

## 2022-11-02 DIAGNOSIS — R197 Diarrhea, unspecified: Secondary | ICD-10-CM | POA: Diagnosis not present

## 2022-11-02 DIAGNOSIS — M542 Cervicalgia: Secondary | ICD-10-CM | POA: Diagnosis not present

## 2022-11-14 ENCOUNTER — Other Ambulatory Visit: Payer: Self-pay | Admitting: Internal Medicine

## 2022-11-14 DIAGNOSIS — K529 Noninfective gastroenteritis and colitis, unspecified: Secondary | ICD-10-CM | POA: Diagnosis not present

## 2022-11-14 DIAGNOSIS — K862 Cyst of pancreas: Secondary | ICD-10-CM

## 2022-11-16 DIAGNOSIS — H34812 Central retinal vein occlusion, left eye, with macular edema: Secondary | ICD-10-CM | POA: Diagnosis not present

## 2022-11-17 DIAGNOSIS — K862 Cyst of pancreas: Secondary | ICD-10-CM | POA: Diagnosis not present

## 2022-11-17 DIAGNOSIS — K529 Noninfective gastroenteritis and colitis, unspecified: Secondary | ICD-10-CM | POA: Diagnosis not present

## 2022-11-21 DIAGNOSIS — M531 Cervicobrachial syndrome: Secondary | ICD-10-CM | POA: Diagnosis not present

## 2022-11-21 DIAGNOSIS — M5413 Radiculopathy, cervicothoracic region: Secondary | ICD-10-CM | POA: Diagnosis not present

## 2022-11-21 DIAGNOSIS — M542 Cervicalgia: Secondary | ICD-10-CM | POA: Diagnosis not present

## 2022-11-22 DIAGNOSIS — Z961 Presence of intraocular lens: Secondary | ICD-10-CM | POA: Diagnosis not present

## 2022-11-22 DIAGNOSIS — H3582 Retinal ischemia: Secondary | ICD-10-CM | POA: Diagnosis not present

## 2022-11-22 DIAGNOSIS — H25811 Combined forms of age-related cataract, right eye: Secondary | ICD-10-CM | POA: Diagnosis not present

## 2022-11-22 DIAGNOSIS — H34812 Central retinal vein occlusion, left eye, with macular edema: Secondary | ICD-10-CM | POA: Diagnosis not present

## 2022-11-22 DIAGNOSIS — H1789 Other corneal scars and opacities: Secondary | ICD-10-CM | POA: Diagnosis not present

## 2022-11-23 DIAGNOSIS — M542 Cervicalgia: Secondary | ICD-10-CM | POA: Diagnosis not present

## 2022-11-23 DIAGNOSIS — M531 Cervicobrachial syndrome: Secondary | ICD-10-CM | POA: Diagnosis not present

## 2022-11-23 DIAGNOSIS — M5413 Radiculopathy, cervicothoracic region: Secondary | ICD-10-CM | POA: Diagnosis not present

## 2022-11-29 DIAGNOSIS — M5413 Radiculopathy, cervicothoracic region: Secondary | ICD-10-CM | POA: Diagnosis not present

## 2022-11-29 DIAGNOSIS — M531 Cervicobrachial syndrome: Secondary | ICD-10-CM | POA: Diagnosis not present

## 2022-11-29 DIAGNOSIS — M542 Cervicalgia: Secondary | ICD-10-CM | POA: Diagnosis not present

## 2022-12-01 DIAGNOSIS — M531 Cervicobrachial syndrome: Secondary | ICD-10-CM | POA: Diagnosis not present

## 2022-12-01 DIAGNOSIS — M542 Cervicalgia: Secondary | ICD-10-CM | POA: Diagnosis not present

## 2022-12-01 DIAGNOSIS — M5413 Radiculopathy, cervicothoracic region: Secondary | ICD-10-CM | POA: Diagnosis not present

## 2022-12-06 DIAGNOSIS — M542 Cervicalgia: Secondary | ICD-10-CM | POA: Diagnosis not present

## 2022-12-06 DIAGNOSIS — M531 Cervicobrachial syndrome: Secondary | ICD-10-CM | POA: Diagnosis not present

## 2022-12-06 DIAGNOSIS — M5413 Radiculopathy, cervicothoracic region: Secondary | ICD-10-CM | POA: Diagnosis not present

## 2022-12-08 DIAGNOSIS — M531 Cervicobrachial syndrome: Secondary | ICD-10-CM | POA: Diagnosis not present

## 2022-12-08 DIAGNOSIS — M542 Cervicalgia: Secondary | ICD-10-CM | POA: Diagnosis not present

## 2022-12-08 DIAGNOSIS — M5413 Radiculopathy, cervicothoracic region: Secondary | ICD-10-CM | POA: Diagnosis not present

## 2022-12-13 DIAGNOSIS — Z1283 Encounter for screening for malignant neoplasm of skin: Secondary | ICD-10-CM | POA: Diagnosis not present

## 2022-12-13 DIAGNOSIS — D225 Melanocytic nevi of trunk: Secondary | ICD-10-CM | POA: Diagnosis not present

## 2022-12-13 DIAGNOSIS — L570 Actinic keratosis: Secondary | ICD-10-CM | POA: Diagnosis not present

## 2022-12-13 DIAGNOSIS — X32XXXA Exposure to sunlight, initial encounter: Secondary | ICD-10-CM | POA: Diagnosis not present

## 2022-12-14 DIAGNOSIS — E785 Hyperlipidemia, unspecified: Secondary | ICD-10-CM | POA: Diagnosis not present

## 2022-12-14 DIAGNOSIS — I10 Essential (primary) hypertension: Secondary | ICD-10-CM | POA: Diagnosis not present

## 2022-12-19 DIAGNOSIS — H34812 Central retinal vein occlusion, left eye, with macular edema: Secondary | ICD-10-CM | POA: Diagnosis not present

## 2022-12-20 DIAGNOSIS — G3184 Mild cognitive impairment, so stated: Secondary | ICD-10-CM | POA: Diagnosis not present

## 2022-12-26 ENCOUNTER — Ambulatory Visit
Admission: RE | Admit: 2022-12-26 | Discharge: 2022-12-26 | Disposition: A | Discharge status: Home / Self Care | Payer: Medicare Other | Source: Ambulatory Visit | Attending: Internal Medicine | Admitting: Internal Medicine

## 2022-12-26 DIAGNOSIS — K862 Cyst of pancreas: Secondary | ICD-10-CM | POA: Diagnosis not present

## 2022-12-26 MED ORDER — GADOPICLENOL 0.5 MMOL/ML IV SOLN
7.0000 mL | Freq: Once | INTRAVENOUS | Status: AC | PRN
  Administered 2022-12-26: 7 mL via INTRAVENOUS

## 2022-12-30 DIAGNOSIS — R4182 Altered mental status, unspecified: Secondary | ICD-10-CM | POA: Diagnosis not present

## 2022-12-30 DIAGNOSIS — R9089 Other abnormal findings on diagnostic imaging of central nervous system: Secondary | ICD-10-CM | POA: Diagnosis not present

## 2022-12-30 DIAGNOSIS — G319 Degenerative disease of nervous system, unspecified: Secondary | ICD-10-CM | POA: Diagnosis not present

## 2022-12-30 DIAGNOSIS — I6782 Cerebral ischemia: Secondary | ICD-10-CM | POA: Diagnosis not present

## 2023-01-06 ENCOUNTER — Ambulatory Visit: Payer: Medicare Other | Attending: Cardiology | Admitting: Cardiology

## 2023-01-06 ENCOUNTER — Encounter: Payer: Self-pay | Admitting: Cardiology

## 2023-01-06 VITALS — BP 108/68 | HR 53 | Ht 68.0 in | Wt 130.0 lb

## 2023-01-06 DIAGNOSIS — I10 Essential (primary) hypertension: Secondary | ICD-10-CM | POA: Diagnosis not present

## 2023-01-06 DIAGNOSIS — R9431 Abnormal electrocardiogram [ECG] [EKG]: Secondary | ICD-10-CM | POA: Diagnosis not present

## 2023-01-06 DIAGNOSIS — E785 Hyperlipidemia, unspecified: Secondary | ICD-10-CM | POA: Insufficient documentation

## 2023-01-06 NOTE — Assessment & Plan Note (Signed)
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  I think were pretty well-controlled at this level.  But not want to be more aggressive given concerns for possible memory loss.  Would not want to titrate statin dose further.   Managed by PCP.

## 2023-01-06 NOTE — Patient Instructions (Signed)
Medication Instructions:  Your physician recommends that you continue on your current medications as directed. Please refer to the Current Medication list given to you today.  *If you need a refill on your cardiac medications before your next appointment, please call your pharmacy*   Follow-Up: At [facility], you and your health needs are our priority.  As part of our continuing mission to provide you with exceptional heart care, we have created designated Provider Care Teams.  These Care Teams include your primary Cardiologist (physician) and Advanced Practice Providers (APPs -  Physician Assistants and Nurse Practitioners) who all work together to provide you with the care you need, when you need it.  We recommend signing up for the patient portal called "MyChart".  Sign up information is provided on this After Visit Summary.  MyChart is used to connect with patients for Virtual Visits (Telemedicine).  Patients are able to view lab/test results, encounter notes, upcoming appointments, etc.  Non-urgent messages can be sent to your provider as well.   To learn more about what you can do with MyChart, go to [email or web address].    Your next appointment:   Follow up appointment as needed Provider:   Bryan Lemma, MD

## 2023-01-06 NOTE — Assessment & Plan Note (Signed)
Well-controlled BP having stopped ARB and reducing calcitriol dose.  Still blood pressure in the 108 range.

## 2023-01-06 NOTE — Progress Notes (Signed)
Cardiology Office Note:  .   Date:  01/06/2023  ID:  Jordan Lang, DOB 1940-10-06, MRN [id number] PCP: Merri Brunette, MD   [facility] Providers Cardiologist:  Bryan Lemma, MD     Chief Complaint  Patient presents with   New Patient (Initial Visit)    Evaluation of abnormal EKG.    History of Present Illness: .     Jordan Lang is a very pleasant and otherwise healthy 82 y.o. male with a PMH notable for HTN, HLD and Coronary Calcium Score 194 who presents here for Cardiology Evaluation of Abnormal EKG at the request of Merri Brunette, MD.  Jordan Lang was seen on 11/02/2022 for routine wellness check.  Major complaint was loose stools but no bloody or tarry stools.  Neck pain and stiffness.  Thus concerned about cognitive abilities with concern for memory loss/onset of dementia.  EKG had small inferior Q waves.-Referred to cardiology.    Subjective  INTERVAL HISTORY Jordan Lang presents here today with one of his sons doing very well.  He has no major complaints.  He actually was not 100% sure as to why he is here.  We discussed his EKG findings which on my review I have minimal criteria for suggesting inferior infarct and on our repeat EKG the pathologic Q waves are only in lead III and therefore would not meet criteria for inferior MI.  He is completely asymptomatic, walks anywhere from 1-1/2 to 2 miles a day 3 to 4 days a week and works out in the gym 3 days a week with no active symptoms of chest pain pressure or dyspnea.  No PND, orthopnea or edema.  No regular heartbeats or palpitations.  No syncope or near syncope.  In the last several years he also denies having had any symptoms of chest pain pressure or tightness.  ROS:  Review of Systems - Negative except GI concerns with loose stools as noted and some neck stiffness.  Says that he does not do as much now as he used to 10 years ago but otherwise doing well.    Objective   Past Medical History:  Diagnosis Date    COVID-19 06/2022   Glaucoma    Right eye   History of kidney stones    Hypercholesteremia    Hypertension    Renal disorder    kidney stones    Past Surgical History:  Procedure Laterality Date   CATARACT EXTRACTION Left 2022   CYSTOSCOPY WITH LITHOLAPAXY N/A 07/14/2022   Procedure: CYSTOSCOPY WITH LITHOLAPAXY;  Surgeon: Marcine Matar, MD;  Location: [facility];  Service: Urology;  Laterality: N/A;   CYSTOSCOPY WITH RETROGRADE PYELOGRAM, URETEROSCOPY AND STENT PLACEMENT Right 07/14/2022   Procedure: CYSTOSCOPY WITH RETROGRADE PYELOGRAM, URETEROSCOPY AND STENT PLACEMENT;  Surgeon: Marcine Matar, MD;  Location: [facility];  Service: Urology;  Laterality: Right;   HERNIA REPAIR     HOLMIUM LASER APPLICATION Right 07/14/2022   Procedure: HOLMIUM LASER APPLICATION;  Surgeon: Marcine Matar, MD;  Location: [facility];  Service: Urology;  Laterality: Right;    reports that he has never smoked. He has never used smokeless tobacco. He reports that he does not currently use alcohol. He reports that he does not use drugs.  Family History  Problem Relation Age of Onset   Alzheimer's disease Mother    Coronary artery disease Father 24   Prostate cancer Father    Coronary artery disease Brother 17  Studies Reviewed: Marland Kitchen   EKG Interpretation Date/Time:  Friday January 06 2023 09:03:02 EDT Ventricular Rate:  46 PR Interval:  160 QRS Duration:  78 QT Interval:  456 QTC Calculation: 399 R Axis:   -13  Text Interpretation: Sinus bradycardia Low voltage QRS No previous ECGs available Q waves noted in III only  - Does not meet criteria for Inf MI, age indeterminate. Confirmed by Bryan Lemma ([id number]) on 01/06/2023 9:04:47 AM   EKG from PCP showed sinus bradycardia, rate 48 bpm, diffuse low voltage with subtle Q waves in inferior leads, suggesting prior inferior infarct.  EKG Interpretation Date/Time:  Friday January 06 2023 09:03:02  EDT Ventricular Rate:  46 PR Interval:  160 QRS Duration:  78 QT Interval:  456 QTC Calculation: 399 R Axis:   -13  Text Interpretation: Sinus bradycardia Low voltage QRS No previous ECGs available Q waves noted in III only  - Does not meet criteria for Inf MI, age indeterminate. Confirmed by Bryan Lemma ([id number]) on 01/06/2023 9:04:47 AM   Coronary Calcium Score 11/18/2020: Calcium score 194-LAD distribution.  Labs 10/25/2022: CBC-W 4.3, H/H13.4/39.9.  PLT 208.  Glucose 101.  BUN/CR 21/0.85.  K+ 4.6.  TC 164, TG 85, HDL 68, LDL 80.   Risk Assessment/Calculations:             Physical Exam:   VS:  BP 108/68 (BP Location: Left Arm, Patient Position: Sitting, Cuff Size: Normal)   Pulse (!) 53   Ht 5\' 8"  (1.727 m)   Wt 130 lb (59 kg)   SpO2 98%   BMI 19.77 kg/m    Wt Readings from Last 3 Encounters:  01/06/23 130 lb (59 kg)  07/14/22 132 lb 4.8 oz (60 kg)  10/28/18 135 lb (61.2 kg)    GEN: Well nourished, well developed in no acute distress; well-groomed.  Healthy-appearing. NECK: No JVD; No carotid bruits CARDIAC: Normal S1, S2; RRR, no murmurs, rubs, gallops RESPIRATORY:  Clear to auscultation without rales, wheezing or rhonchi ; nonlabored, good air movement. ABDOMEN: Soft, non-tender, non-distended EXTREMITIES:  No edema; No deformity     ASSESSMENT AND PLAN: .    Problem List Items Addressed This Visit       Cardiology Problems   Hyperlipidemia LDL goal <100 (Chronic)    Chronic condition in a 82 year old gentleman with an LDL of 80 and a Coronary Calcium Score of 194 at age 64.  I think were pretty well-controlled at this level.  But not want to be more aggressive given concerns for possible memory loss.  Would not want to titrate statin dose further.   Managed by PCP.      Relevant Medications   amLODipine (NORVASC) 5 MG tablet   Essential hypertension (Chronic)    Well-controlled BP having stopped ARB and reducing calcitriol dose.  Still blood pressure in  the 108 range.      Relevant Medications   amLODipine (NORVASC) 5 MG tablet     Other   Nonspecific abnormal electrocardiogram (ECG) (EKG) - Primary    EKG from PCP had borderline criteria for inferior MI, age-indeterminate.  However current EKG did not show criteria for inferior MI.  He does not have any sign symptoms to suggest prior MI and is completely active without any heart failure or angina symptoms.   As such, I do not see any clear indication for further evaluation.  He is Coronary Calcium Score@was  only 194 at age 19, and the calcification was all in the LAD  distribution not consistent with with a potential inferior MI distribution.  Where he to have concerning issues I would consider a 2D echo, but since he is completely asymptomatic I do not know that this will change our management much.  Therefore I recommend simply monitoring for change in symptoms.  Continue blood pressure and lipid management per PCP.  Return for cardiology evaluation if symptoms warrant.      Relevant Orders   EKG 12-Lead (Completed)            Dispo: Return for Followup when necessary.  Total time spent: 24 min spent with patient + 21 min spent charting = 45 min     Signed, Marykay Lex, MD, MS Bryan Lemma, M.D., M.S. Interventional Cardiologist  [facility]  Pager # [phone] Phone # [phone] [address] [place], [place] [address]

## 2023-01-06 NOTE — Assessment & Plan Note (Signed)
EKG from PCP had borderline criteria for inferior MI, age-indeterminate.  However current EKG did not show criteria for inferior MI.  He does not have any sign symptoms to suggest prior MI and is completely active without any heart failure or angina symptoms.   As such, I do not see any clear indication for further evaluation.  He is Coronary Calcium Score@was  only 194 at age 82, and the calcification was all in the LAD distribution not consistent with with a potential inferior MI distribution.  Where he to have concerning issues I would consider a 2D echo, but since he is completely asymptomatic I do not know that this will change our management much.  Therefore I recommend simply monitoring for change in symptoms.  Continue blood pressure and lipid management per PCP.  Return for cardiology evaluation if symptoms warrant.

## 2023-01-11 DIAGNOSIS — I10 Essential (primary) hypertension: Secondary | ICD-10-CM | POA: Diagnosis not present

## 2023-01-11 DIAGNOSIS — E785 Hyperlipidemia, unspecified: Secondary | ICD-10-CM | POA: Diagnosis not present

## 2023-01-16 DIAGNOSIS — H34812 Central retinal vein occlusion, left eye, with macular edema: Secondary | ICD-10-CM | POA: Diagnosis not present

## 2023-01-26 DIAGNOSIS — Z23 Encounter for immunization: Secondary | ICD-10-CM | POA: Diagnosis not present

## 2023-01-26 DIAGNOSIS — R160 Hepatomegaly, not elsewhere classified: Secondary | ICD-10-CM | POA: Diagnosis not present

## 2023-01-26 DIAGNOSIS — R17 Unspecified jaundice: Secondary | ICD-10-CM | POA: Diagnosis not present

## 2023-01-26 DIAGNOSIS — R634 Abnormal weight loss: Secondary | ICD-10-CM | POA: Diagnosis not present

## 2023-01-26 DIAGNOSIS — M545 Low back pain, unspecified: Secondary | ICD-10-CM | POA: Diagnosis not present

## 2023-01-27 ENCOUNTER — Other Ambulatory Visit: Payer: Self-pay | Admitting: Internal Medicine

## 2023-01-27 DIAGNOSIS — R634 Abnormal weight loss: Secondary | ICD-10-CM

## 2023-01-27 DIAGNOSIS — Z23 Encounter for immunization: Secondary | ICD-10-CM | POA: Diagnosis not present

## 2023-01-30 ENCOUNTER — Other Ambulatory Visit: Payer: Medicare Other

## 2023-01-31 ENCOUNTER — Ambulatory Visit
Admission: RE | Admit: 2023-01-31 | Discharge: 2023-01-31 | Disposition: A | Discharge status: Home / Self Care | Payer: Medicare Other | Source: Ambulatory Visit | Attending: Internal Medicine | Admitting: Internal Medicine

## 2023-01-31 DIAGNOSIS — R634 Abnormal weight loss: Secondary | ICD-10-CM | POA: Diagnosis not present

## 2023-01-31 DIAGNOSIS — R17 Unspecified jaundice: Secondary | ICD-10-CM | POA: Diagnosis not present

## 2023-01-31 DIAGNOSIS — N200 Calculus of kidney: Secondary | ICD-10-CM | POA: Diagnosis not present

## 2023-02-01 ENCOUNTER — Other Ambulatory Visit: Payer: Self-pay | Admitting: Gastroenterology

## 2023-02-01 DIAGNOSIS — K862 Cyst of pancreas: Secondary | ICD-10-CM | POA: Diagnosis not present

## 2023-02-01 DIAGNOSIS — K838 Other specified diseases of biliary tract: Secondary | ICD-10-CM | POA: Diagnosis not present

## 2023-02-01 DIAGNOSIS — R748 Abnormal levels of other serum enzymes: Secondary | ICD-10-CM | POA: Diagnosis not present

## 2023-02-01 DIAGNOSIS — R17 Unspecified jaundice: Secondary | ICD-10-CM | POA: Diagnosis not present

## 2023-02-02 ENCOUNTER — Encounter (HOSPITAL_COMMUNITY): Payer: Self-pay | Admitting: Gastroenterology

## 2023-02-02 DIAGNOSIS — K862 Cyst of pancreas: Secondary | ICD-10-CM | POA: Diagnosis not present

## 2023-02-02 DIAGNOSIS — R17 Unspecified jaundice: Secondary | ICD-10-CM | POA: Diagnosis not present

## 2023-02-02 DIAGNOSIS — K838 Other specified diseases of biliary tract: Secondary | ICD-10-CM | POA: Diagnosis not present

## 2023-02-08 ENCOUNTER — Encounter (HOSPITAL_COMMUNITY): Payer: Self-pay | Admitting: Gastroenterology

## 2023-02-08 ENCOUNTER — Telehealth (HOSPITAL_COMMUNITY): Payer: Self-pay

## 2023-02-08 ENCOUNTER — Ambulatory Visit (HOSPITAL_COMMUNITY): Payer: Medicare Other

## 2023-02-08 ENCOUNTER — Other Ambulatory Visit: Payer: Self-pay

## 2023-02-08 ENCOUNTER — Encounter (HOSPITAL_COMMUNITY)
Admission: RE | Disposition: A | Discharge status: Home / Self Care | Payer: Self-pay | Source: Home / Self Care | Attending: Gastroenterology

## 2023-02-08 ENCOUNTER — Ambulatory Visit (HOSPITAL_BASED_OUTPATIENT_CLINIC_OR_DEPARTMENT_OTHER): Payer: Self-pay

## 2023-02-08 ENCOUNTER — Ambulatory Visit (HOSPITAL_COMMUNITY)
Admission: RE | Admit: 2023-02-08 | Discharge: 2023-02-08 | Disposition: A | Discharge status: Home / Self Care | Payer: Medicare Other | Attending: Gastroenterology | Admitting: Gastroenterology

## 2023-02-08 DIAGNOSIS — I10 Essential (primary) hypertension: Secondary | ICD-10-CM | POA: Diagnosis not present

## 2023-02-08 DIAGNOSIS — K828 Other specified diseases of gallbladder: Secondary | ICD-10-CM | POA: Diagnosis not present

## 2023-02-08 DIAGNOSIS — K2980 Duodenitis without bleeding: Secondary | ICD-10-CM | POA: Diagnosis not present

## 2023-02-08 DIAGNOSIS — E785 Hyperlipidemia, unspecified: Secondary | ICD-10-CM

## 2023-02-08 DIAGNOSIS — K2289 Other specified disease of esophagus: Secondary | ICD-10-CM | POA: Insufficient documentation

## 2023-02-08 DIAGNOSIS — K3189 Other diseases of stomach and duodenum: Secondary | ICD-10-CM | POA: Diagnosis not present

## 2023-02-08 DIAGNOSIS — R188 Other ascites: Secondary | ICD-10-CM | POA: Insufficient documentation

## 2023-02-08 DIAGNOSIS — R17 Unspecified jaundice: Secondary | ICD-10-CM | POA: Diagnosis not present

## 2023-02-08 DIAGNOSIS — K862 Cyst of pancreas: Secondary | ICD-10-CM | POA: Insufficient documentation

## 2023-02-08 DIAGNOSIS — K269 Duodenal ulcer, unspecified as acute or chronic, without hemorrhage or perforation: Secondary | ICD-10-CM | POA: Diagnosis not present

## 2023-02-08 DIAGNOSIS — K319 Disease of stomach and duodenum, unspecified: Secondary | ICD-10-CM | POA: Diagnosis not present

## 2023-02-08 DIAGNOSIS — R748 Abnormal levels of other serum enzymes: Secondary | ICD-10-CM | POA: Insufficient documentation

## 2023-02-08 DIAGNOSIS — R932 Abnormal findings on diagnostic imaging of liver and biliary tract: Secondary | ICD-10-CM | POA: Diagnosis not present

## 2023-02-08 DIAGNOSIS — K839 Disease of biliary tract, unspecified: Secondary | ICD-10-CM | POA: Diagnosis not present

## 2023-02-08 DIAGNOSIS — K838 Other specified diseases of biliary tract: Secondary | ICD-10-CM | POA: Diagnosis not present

## 2023-02-08 HISTORY — PX: ESOPHAGOGASTRODUODENOSCOPY: SHX5428

## 2023-02-08 HISTORY — PX: BIOPSY: SHX5522

## 2023-02-08 HISTORY — PX: ESOPHAGOGASTRODUODENOSCOPY (EGD) WITH PROPOFOL: SHX5813

## 2023-02-08 HISTORY — PX: EUS: SHX5427

## 2023-02-08 SURGERY — ESOPHAGEAL ENDOSCOPIC ULTRASOUND (EUS) RADIAL
Anesthesia: General

## 2023-02-08 MED ORDER — ONDANSETRON HCL 4 MG/2ML IJ SOLN
INTRAMUSCULAR | Status: DC | PRN
  Administered 2023-02-08: 4 mg via INTRAVENOUS

## 2023-02-08 MED ORDER — GLUCAGON HCL RDNA (DIAGNOSTIC) 1 MG IJ SOLR
INTRAMUSCULAR | Status: AC
  Filled 2023-02-08: qty 1

## 2023-02-08 MED ORDER — FENTANYL CITRATE (PF) 100 MCG/2ML IJ SOLN
INTRAMUSCULAR | Status: DC | PRN
  Administered 2023-02-08: 50 ug via INTRAVENOUS

## 2023-02-08 MED ORDER — LACTATED RINGERS IV SOLN
INTRAVENOUS | Status: AC | PRN
  Administered 2023-02-08: 1000 mL via INTRAVENOUS

## 2023-02-08 MED ORDER — CIPROFLOXACIN IN D5W 400 MG/200ML IV SOLN
INTRAVENOUS | Status: DC | PRN
Start: 2023-02-08 — End: 2023-02-08
  Administered 2023-02-08: 400 mg via INTRAVENOUS

## 2023-02-08 MED ORDER — PROPOFOL 10 MG/ML IV BOLUS
INTRAVENOUS | Status: DC | PRN
  Administered 2023-02-08: 120 mg via INTRAVENOUS

## 2023-02-08 MED ORDER — PHENYLEPHRINE HCL-NACL 20-0.9 MG/250ML-% IV SOLN
INTRAVENOUS | Status: DC | PRN
  Administered 2023-02-08: 50 ug/min via INTRAVENOUS

## 2023-02-08 MED ORDER — DICLOFENAC SUPPOSITORY 100 MG
RECTAL | Status: DC | PRN
  Administered 2023-02-08: 100 mg via RECTAL

## 2023-02-08 MED ORDER — DICLOFENAC SUPPOSITORY 100 MG
RECTAL | Status: AC
  Filled 2023-02-08: qty 1

## 2023-02-08 MED ORDER — SUGAMMADEX SODIUM 200 MG/2ML IV SOLN
INTRAVENOUS | Status: DC | PRN
Start: 2023-02-08 — End: 2023-02-08
  Administered 2023-02-08: 200 mg via INTRAVENOUS

## 2023-02-08 MED ORDER — PROPOFOL 10 MG/ML IV BOLUS
INTRAVENOUS | Status: AC
  Filled 2023-02-08: qty 20

## 2023-02-08 MED ORDER — ROCURONIUM BROMIDE 100 MG/10ML IV SOLN
INTRAVENOUS | Status: DC | PRN
  Administered 2023-02-08: 40 mg via INTRAVENOUS

## 2023-02-08 MED ORDER — SODIUM CHLORIDE 0.9 % IV SOLN: INTRAVENOUS | Status: DC | PRN

## 2023-02-08 MED ORDER — FENTANYL CITRATE (PF) 100 MCG/2ML IJ SOLN
INTRAMUSCULAR | Status: AC
  Filled 2023-02-08: qty 2

## 2023-02-08 MED ORDER — DEXAMETHASONE SODIUM PHOSPHATE 10 MG/ML IJ SOLN
INTRAMUSCULAR | Status: DC | PRN
  Administered 2023-02-08: 10 mg via INTRAVENOUS

## 2023-02-08 MED ORDER — LIDOCAINE HCL (CARDIAC) PF 100 MG/5ML IV SOSY
PREFILLED_SYRINGE | INTRAVENOUS | Status: DC | PRN
  Administered 2023-02-08: 60 mg via INTRAVENOUS

## 2023-02-08 MED ORDER — CIPROFLOXACIN IN D5W 400 MG/200ML IV SOLN
INTRAVENOUS | Status: AC
  Filled 2023-02-08: qty 200

## 2023-02-08 MED ORDER — SODIUM CHLORIDE 0.9 % IV SOLN: INTRAVENOUS | Status: DC

## 2023-02-08 SURGICAL SUPPLY — 15 items

## 2023-02-08 NOTE — Anesthesia Postprocedure Evaluation (Signed)
Anesthesia Post Note  Patient: DECARLO MATSEN  Procedure(s) Performed: ESOPHAGEAL ENDOSCOPIC ULTRASOUND (EUS) RADIAL ESOPHAGOGASTRODUODENOSCOPY (EGD) WITH PROPOFOL ESOPHAGOGASTRODUODENOSCOPY (EGD) BIOPSY     Patient location during evaluation: PACU Anesthesia Type: General Level of consciousness: awake and alert Pain management: pain level controlled Vital Signs Assessment: post-procedure vital signs reviewed and stable Respiratory status: spontaneous breathing, nonlabored ventilation, respiratory function stable and patient connected to nasal cannula oxygen Cardiovascular status: stable and blood pressure returned to baseline Postop Assessment: no apparent nausea or vomiting Anesthetic complications: no  No notable events documented.  Last Vitals:  Vitals:   02/08/23 1120 02/08/23 1130  BP: (!) 156/76 (!) 140/76  Pulse: (!) 51 (!) 58  Resp: 17 17  Temp:    SpO2: 99% 99%    Last Pain:  Vitals:   02/08/23 1130  TempSrc:   PainSc: 0-No pain                 Kennieth Rad

## 2023-02-08 NOTE — Progress Notes (Unsigned)
Gilmer Mor, DO  Gilmer Mor, DO; Leodis Rains D; [place] [facility] Procedure Requests Cc: Richardson Dopp OK for image guided percutaneous trans-hepatic cholangiogram and drainage.  We will need to get this on schedule this week if possible.  Unfortunately the referring physician is unable to work out a direct admission.  The day this patient comes in, we will need to contact the hospital team to admit.  Anticipate 23 hour observation.  [address]

## 2023-02-08 NOTE — Progress Notes (Unsigned)
Gilmer Mor, DO  Leodis Rains D; [facility] Procedure Requests The GI referring doctor is going to work out a direct admit on Thursday or Friday of this week with the medicine team.    They will contact us for which day.  We will set this up as an inpt when the patient arrives as an admission.  We will not schedule as outpt drainage.  Discussed with the referring.  [address]oreta [address]

## 2023-02-08 NOTE — Op Note (Signed)
[facility] Patient Name: Jordan Lang Procedure Date: 02/08/2023 MRN: [id number] Attending MD: Kerin Salen , MD, [id number] Date of Birth: Jul 19, 1940 CSN: [id number] Age: 82 Admit Type: Outpatient Procedure:                Upper GI endoscopy Indications:              Painless jaundice, initial plan for ERCP and stent                            placement was aborted as the duodenal mass did not                            allow passage of duodenoscope(11.6 mm) and the                            procedure was converted to an EGD with an adult                            gastroscope 9.2 mm Providers:                Kerin Salen, MD, Willis Modena, MD, Doristine Mango, RN, Kandice Robinsons, Technician Referring MD:             Liliane Shi, DO Medicines:                Monitored Anesthesia Care Complications:            No immediate complications. Estimated blood loss:                            Minimal. Estimated Blood Loss:     Estimated blood loss was minimal. Procedure:                Pre-Anesthesia Assessment:                           - Prior to the procedure, a History and Physical                            was performed, and patient medications and                            allergies were reviewed. The patient's tolerance of                            previous anesthesia was also reviewed. The risks                            and benefits of the procedure and the sedation                            options and risks were discussed with the patient.  All questions were answered, and informed consent                            was obtained. Prior Anticoagulants: The patient has                            taken no anticoagulant or antiplatelet agents. ASA                            Grade Assessment: III - A patient with severe                            systemic disease. After reviewing the risks and                             benefits, the patient was deemed in satisfactory                            condition to undergo the procedure.                           - Prior to the procedure, a History and Physical                            was performed, and patient medications and                            allergies were reviewed. The patient's tolerance of                            previous anesthesia was also reviewed. The risks                            and benefits of the procedure and the sedation                            options and risks were discussed with the patient.                            All questions were answered, and informed consent                            was obtained. Prior Anticoagulants: The patient has                            taken no anticoagulant or antiplatelet agents. ASA                            Grade Assessment: III - A patient with severe                            systemic disease. After reviewing the risks and  benefits, the patient was deemed in satisfactory                            condition to undergo the procedure.                           After obtaining informed consent, the endoscope was                            passed under direct vision. Throughout the                            procedure, the patient's blood pressure, pulse, and                            oxygen saturations were monitored continuously. The                            [id number]-[id number] ([id number]) Olympus duodenoscope was                            introduced through the mouth, and advanced to the                            second part of duodenum. The [id number]-[id number] ([id number])                            Olympus endoscope was introduced through the and                            used to inject contrast into. After obtaining                            informed consent, the endoscope was passed under                            direct vision. Throughout the procedure, the                             patient's blood pressure, pulse, and oxygen                            saturations were monitored continuously. The upper                            GI endoscopy was performed with moderate difficulty                            due to a partially obstructing mass. The patient                            tolerated the procedure well. Scope In: Scope Out: Findings:      Initial plan for ERCP and stent placement was aborted as the duodenal  mass did not allow passage of duodenoscope(11.6 mm diameter) and the       procedure was converted to an EGD with an adult gastroscope(9.2 mm       diameter).      The upper third of the esophagus and middle third of the esophagus were       normal.      There were esophageal mucosal changes suspicious for Barrett's esophagus       present in the lower third of the esophagus. The maximum longitudinal       extent of these mucosal changes was 1 cm in length. Mucosa was biopsied       with a cold forceps for histology in a targeted manner. One specimen       bottle was sent to pathology.      Multiple dispersed small erosions with no bleeding and no stigmata of       recent bleeding were found in the gastric fundus and in the gastric body.      The cardia and gastric fundus were normal on retroflexion.      A large infiltrative, sessile and ulcerated mass, partially obstructive,       with no bleeding was found in the first portion of the duodenum and in       the second portion of the duodenum. Biopsies were taken with a cold       forceps for histology.      The scope could be advanced beyond the mass with gentle pressure but       ampulla could not be visualized. Impression:               - Normal upper third of esophagus and middle third                            of esophagus.                           - Esophageal mucosal changes suspicious for                            Barrett's esophagus. Biopsied.                            - Erosive gastropathy with no bleeding and no                            stigmata of recent bleeding.                           - Rule out malignancy, duodenal mass. Biopsied. Moderate Sedation:      Patient did not receive moderate sedation for this procedure, but       instead received monitored anesthesia care. Recommendation:           - Await pathology results.                           - Full liquid diet.                           - Patient will likely need IR guided PTC for  biliary drainage. Procedure Code(s):        --- Professional ---                           [phone], Esophagogastroduodenoscopy, flexible,                            transoral; with biopsy, single or multiple Diagnosis Code(s):        --- Professional ---                           K22.89, Other specified disease of esophagus                           K31.89, Other diseases of stomach and duodenum CPT copyright 2022 American [employer] Association. All rights reserved. The codes documented in this report are preliminary and upon coder review may  be revised to meet current compliance requirements. Kerin Salen, MD 02/08/2023 11:00:51 AM This report has been signed electronically. Willis Modena, MD Number of Addenda: 0

## 2023-02-08 NOTE — Discharge Instructions (Signed)
YOU HAD AN ENDOSCOPIC PROCEDURE TODAY: Refer to the procedure report and other information in the discharge instructions given to you for any specific questions about what was found during the examination. If this information does not answer your questions, please call [facility] office at [phone] to clarify.   YOU SHOULD EXPECT: Some feelings of bloating in the abdomen. Passage of more gas than usual. Walking can help get rid of the air that was put into your GI tract during the procedure and reduce the bloating. If you had a lower endoscopy (such as a colonoscopy or flexible sigmoidoscopy) you may notice spotting of blood in your stool or on the toilet paper. Some abdominal soreness may be present for a day or two, also.  DIET: Your first meal following the procedure should be a light meal and then it is ok to progress to your normal diet. A half-sandwich or bowl of soup is an example of a good first meal. Heavy or fried foods are harder to digest and may make you feel nauseous or bloated. Drink plenty of fluids but you should avoid alcoholic beverages for 24 hours. If you had a esophageal dilation, please see attached instructions for diet.    ACTIVITY: Your care partner should take you home directly after the procedure. You should plan to take it easy, moving slowly for the rest of the day. You can resume normal activity the day after the procedure however YOU SHOULD NOT DRIVE, use power tools, machinery or perform tasks that involve climbing or major physical exertion for 24 hours (because of the sedation medicines used during the test).   SYMPTOMS TO REPORT IMMEDIATELY: A gastroenterologist can be reached at any hour. Please call [phone]  for any of the following symptoms:  Following lower endoscopy (colonoscopy, flexible sigmoidoscopy) Excessive amounts of blood in the stool  Significant tenderness, worsening of abdominal pains  Swelling of the abdomen that is new, acute  Fever of 100  or higher  Following upper endoscopy (EGD, EUS, ERCP, esophageal dilation) Vomiting of blood or coffee ground material  New, significant abdominal pain  New, significant chest pain or pain under the shoulder blades  Painful or persistently difficult swallowing  New shortness of breath  Black, tarry-looking or red, bloody stools  FOLLOW UP:  If any biopsies were taken you will be contacted by phone or by letter within the next 1-3 weeks. Call [phone]  if you have not heard about the biopsies in 3 weeks.  Please also call with any specific questions about appointments or follow up tests. YOU HAD AN ENDOSCOPIC PROCEDURE TODAY: Refer to the procedure report and other information in the discharge instructions given to you for any specific questions about what was found during the examination. If this information does not answer your questions, please call [facility] office at [phone] to clarify.

## 2023-02-08 NOTE — Anesthesia Preprocedure Evaluation (Signed)
Anesthesia Evaluation  Patient identified by MRN, date of birth, ID band Patient awake    Reviewed: Allergy & Precautions, NPO status , Patient's Chart, lab work & pertinent test results  Airway Mallampati: II  TM Distance: >3 FB Neck ROM: Full    Dental  (+) Dental Advisory Given   Pulmonary neg pulmonary ROS   breath sounds clear to auscultation       Cardiovascular hypertension,  Rhythm:Regular Rate:Normal     Neuro/Psych negative neurological ROS     GI/Hepatic negative GI ROS,,,Obstructive jaundice    Endo/Other  negative endocrine ROS    Renal/GU Renal disease     Musculoskeletal   Abdominal   Peds  Hematology negative hematology ROS (+)   Anesthesia Other Findings   Reproductive/Obstetrics                             Anesthesia Physical Anesthesia Plan  ASA: 3  Anesthesia Plan: General   Post-op Pain Management:    Induction: Intravenous  PONV Risk Score and Plan: 2 and Dexamethasone and Ondansetron  Airway Management Planned:   Additional Equipment:   Intra-op Plan:   Post-operative Plan: Extubation in OR  Informed Consent: I have reviewed the patients History and Physical, chart, labs and discussed the procedure including the risks, benefits and alternatives for the proposed anesthesia with the patient or authorized representative who has indicated his/her understanding and acceptance.       Plan Discussed with: CRNA  Anesthesia Plan Comments:        Anesthesia Quick Evaluation

## 2023-02-08 NOTE — Transfer of Care (Signed)
Immediate Anesthesia Transfer of Care Note  Patient: Jordan Lang  Procedure(s) Performed: ESOPHAGEAL ENDOSCOPIC ULTRASOUND (EUS) RADIAL ESOPHAGOGASTRODUODENOSCOPY (EGD) WITH PROPOFOL ESOPHAGOGASTRODUODENOSCOPY (EGD) BIOPSY  Patient Location: PACU and Endoscopy Unit  Anesthesia Type:General  Level of Consciousness: awake, alert , and oriented  Airway & Oxygen Therapy: Patient Spontanous Breathing and Patient connected to face mask oxygen  Post-op Assessment: Report given to RN and Post -op Vital signs reviewed and stable  Post vital signs: Reviewed and stable  Last Vitals:  Vitals Value Taken Time  BP    Temp    Pulse 49 02/08/23 1104  Resp 14 02/08/23 1104  SpO2 100 % 02/08/23 1104  Vitals shown include unfiled device data.  Last Pain:  Vitals:   02/08/23 0744  TempSrc: Temporal  PainSc: 0-No pain         Complications: No notable events documented.

## 2023-02-08 NOTE — Anesthesia Procedure Notes (Signed)
Procedure Name: Intubation Date/Time: 02/08/2023 9:51 AM  Performed by: Maurene Capes, CRNAPre-anesthesia Checklist: Patient identified, Emergency Drugs available, Suction available and Patient being monitored Patient Re-evaluated:Patient Re-evaluated prior to induction Oxygen Delivery Method: Circle System Utilized Preoxygenation: Pre-oxygenation with 100% oxygen Induction Type: IV induction Ventilation: Mask ventilation without difficulty Tube type: Oral Tube size: 7.5 mm Number of attempts: 1 Airway Equipment and Method: Rigid stylet Placement Confirmation: ETT inserted through vocal cords under direct vision, positive ETCO2 and breath sounds checked- equal and bilateral Secured at: 23 cm Tube secured with: Tape Dental Injury: Teeth and Oropharynx as per pre-operative assessment

## 2023-02-08 NOTE — Op Note (Addendum)
[facility] Patient Name: Jordan Lang Procedure Date: 02/08/2023 MRN: [id number] Attending MD: Willis Modena , MD, [id number] Date of Birth: 1941-03-19 CSN: [id number] Age: 82 Admit Type: Outpatient Procedure:                Upper EUS Indications:              Common bile duct dilation (acquired) seen on CT                            scan, Elevated liver enzymes Providers:                Willis Modena, MD, Doristine Mango, RN, Kandice Robinsons, Technician Referring MD:              Medicines:                General Anesthesia Complications:            No immediate complications. Estimated Blood Loss:     Estimated blood loss: none. Procedure:                Pre-Anesthesia Assessment:                           - Prior to the procedure, a History and Physical                            was performed, and patient medications and                            allergies were reviewed. The patient's tolerance of                            previous anesthesia was also reviewed. The risks                            and benefits of the procedure and the sedation                            options and risks were discussed with the patient.                            All questions were answered, and informed consent                            was obtained. Prior Anticoagulants: The patient has                            taken no anticoagulant or antiplatelet agents. ASA                            Grade Assessment: III - A patient with severe  systemic disease. After reviewing the risks and                            benefits, the patient was deemed in satisfactory                            condition to undergo the procedure.                           After obtaining informed consent, the endoscope was                            passed under direct vision. Throughout the                            procedure, the patient's blood  pressure, pulse, and                            oxygen saturations were monitored continuously. The                            [id number] Olympus linear ultrasound scope                            was introduced through the mouth, and advanced to                            the duodenal bulb. The upper EUS was accomplished                            without difficulty. The patient tolerated the                            procedure well. Scope In: Scope Out: Findings:      ENDOSCOPIC FINDING: :      Diffuse moderate mucosal changes characterized by congestion and       erythema were found in the duodenal bulb. I was not able to pass the EUS       scope distal to this region, therefore was not able to visualize the       ampulla or descending duodenum.      ENDOSONOGRAPHIC FINDING: :      Moderate hyperechoic material consistent with sludge was visualized       endosonographically in the common bile duct and in the gallbladder.      There was dilation in the common bile duct which measured up to 18 mm.       Appeared to be some sort of solid tissue ingrowth into the distal CBD.       Abrupt cut-off at transition between head/uncinate pancreas.      No lymphadenopathy seen.      An anechoic lesion suggestive of a cyst was identified in the pancreatic       head. It is not in obvious communication with the pancreatic duct. The       lesion measured 20 mm by 20 mm in maximal cross-sectional diameter.  There were a few compartments without septae. The outer wall of the       lesion was not seen. There was no associated mass. There was no internal       debris within the fluid-filled cavity.      perihepatic ascites      4-5 cm diameter abnormal round faintly hyperechoic region in ? neck of       pancreas or perihepatic or even cluster of bowel (due to anechoic       non-vascular spaces, can be seen with thickened cluster of small bowel).       I am not sure this is mass lesion  or bowel, thus did not biopsy.      Could not pass the EUS scope into the descending duodenum or ampulla due       to duodenal bulbar edema vs stricture. Thus could not completely assess       for pancreatic or ampullary mass. Impression:               - Mucosal changes in the duodenum. Edema, unable to                            pass EUS scope into the descending duodenum.                           - Hyperechoic material consistent with sludge was                            visualized endosonographically in the common bile                            duct and in the gallbladder.                           - There was dilation in the common bile duct which                            measured up to 18 mm with abrupt cut-off.                            Suggestion of tissue ingrowth into the distal CBD.                           - A cystic lesion was seen in the pancreatic head.                           - Ill-defined perihepatic vs pancreatic versus                            edematous bowel in body pancreas. Given lack of                            clarity on this, and possibility of this being                            bowel, I did not biopsy.                           -  Perhepatic ascites. Moderate Sedation:      None Recommendation:           - Perform an ERCP today.                           - Overall impression most worrisome for malignancy.                           - Pending ERCP findings, may need repeat MRI/MRCP                            with contrast for further characterization,                            especially this ?perhepatic (vs other) region. Procedure Code(s):        --- Professional ---                           [phone], Esophagogastroduodenoscopy, flexible,                            transoral; with endoscopic ultrasound examination,                            including the esophagus, stomach, and either the                            duodenum or a surgically altered  stomach where the                            jejunum is examined distal to the anastomosis Diagnosis Code(s):        --- Professional ---                           K31.89, Other diseases of stomach and duodenum                           K83.8, Other specified diseases of biliary tract                           K86.2, Cyst of pancreas                           R74.8, Abnormal levels of other serum enzymes CPT copyright 2022 American [employer] Association. All rights reserved. The codes documented in this report are preliminary and upon coder review may  be revised to meet current compliance requirements. Willis Modena, MD 02/08/2023 10:33:30 AM This report has been signed electronically. Number of Addenda: 0

## 2023-02-08 NOTE — Telephone Encounter (Signed)
-----   Message from Bennie Dallas sent at 02/08/2023  3:30 PM EDT ----- Regarding: RE: PTC PROCEDURE / BIOPSY REVIEW Date: 02/08/23  Requested Procedure: percutaneous cholangiogram, biliary drain placement Reason for request:  biliary obstruction Imaging review: CT AP 01/31/23  Decision: Approved Imaging modality to perform: IR Schedule with: Moderate Sedation Schedule for: Any VIR  Additional comments:  @Schedulers . Will need to coordinate overnight observation/admission.  Please contact me with questions, concerns, or if issue pertaining to this request arise.  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 Liliane Shi [facility] [phone]  Imaging: CT abd/pelvis done 9/10 in Epic  Please review.   Thanks,  Fara Boros

## 2023-02-08 NOTE — H&P (Signed)
[facility] H/P Note  Chief Complaint: obstructive jaundice  HPI: Jordan Lang is an 82 y.o. male.  Presenting new onset obstructive jaundice.  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  Now new onset jaundice and back pain and 15 lb weight loss.  Imaging now shows new biliary ductal dilatation.  Past Medical History:  Diagnosis Date   COVID-19 06/2022   Glaucoma    Right eye   History of kidney stones    Hypercholesteremia    Hypertension    Renal disorder    kidney stones    Past Surgical History:  Procedure Laterality Date   CATARACT EXTRACTION Left 2022   CYSTOSCOPY WITH LITHOLAPAXY N/A 07/14/2022   Procedure: CYSTOSCOPY WITH LITHOLAPAXY;  Surgeon: Marcine Matar, MD;  Location: [facility];  Service: Urology;  Laterality: N/A;   CYSTOSCOPY WITH RETROGRADE PYELOGRAM, URETEROSCOPY AND STENT PLACEMENT Right 07/14/2022   Procedure: CYSTOSCOPY WITH RETROGRADE PYELOGRAM, URETEROSCOPY AND STENT PLACEMENT;  Surgeon: Marcine Matar, MD;  Location: [facility];  Service: Urology;  Laterality: Right;   HERNIA REPAIR     HOLMIUM LASER APPLICATION Right 07/14/2022   Procedure: HOLMIUM LASER APPLICATION;  Surgeon: Marcine Matar, MD;  Location: [facility];  Service: Urology;  Laterality: Right;    Medications Prior to Admission  Medication Sig Dispense Refill   amLODipine (NORVASC) 5 MG tablet Take 1 tablet by mouth daily.     calcium citrate (CALCITRATE - DOSED IN MG ELEMENTAL CALCIUM) 950 (200 Ca) MG tablet Take 200 mg of elemental calcium by mouth daily.     Cholecalciferol (VITAMIN D3) 2000 units TABS Take 1 tablet by mouth daily.     Cyanocobalamin (VITAMIN B 12 PO) Take 2,500 mg by mouth daily.     dorzolamide (TRUSOPT) 2 % ophthalmic solution 1 drop 2 (two) times daily.     latanoprost (XALATAN) 0.005 % ophthalmic solution Place 1 drop into  the right eye daily.     Multiple Vitamins-Minerals (CENTRUM ADULTS PO) Take 1 tablet by mouth.     polycarbophil (FIBERCON) 625 MG tablet Take 625 mg by mouth daily.     PSYLLIUM HUSK PO Take 5 capsules by mouth daily.     rosuvastatin (CRESTOR) 10 MG tablet Take 10 mg by mouth daily.     denosumab (PROLIA) 60 MG/ML SOSY injection Inject 60 mg into the skin every 6 (six) months.      Allergies:  Allergies  Allergen Reactions   Flomax [Tamsulosin Hcl] Other (See Comments)    Dizziness    Family History  Problem Relation Age of Onset   Alzheimer's disease Mother    Coronary artery disease Father 45   Prostate cancer Father    Coronary artery disease Brother 77    Social History:  reports that he has never smoked. He has never used smokeless tobacco. He reports that he does not currently use alcohol. He reports that he does not use drugs.   ROS:  As per HPI, all others negative   Blood pressure (!) 176/79, pulse (!) 58, temperature (!) 97.4 F (36.3 C), temperature source Temporal, resp. rate 12, height 5\' 7"  (1.702 m), weight 55.3 kg, SpO2 100%. General appearance: Thin, jaundiced HEENT:  Scleral icterus CV:  Regular RESP:  No respiratory distress ABD:  Soft, mild epigastric tenderness without peritonitis NEURO:  No encephalopathy  No results found for this or any previous  visit (from the past 48 hour(s)). No results found.  Assessment/Plan   Pancreatic cysts. New onset obstructive jaundice. Elevated LFTs. Back pain. Weight loss. Upper endoscopic ultrasound with possible fine needle aspiration. Risks (bleeding, infection, bowel perforation that could require surgery, sedation-related changes in cardiopulmonary systems), benefits (identification and possible treatment of source of symptoms, exclusion of certain causes of symptoms), and alternatives (watchful waiting, radiographic imaging studies, empiric medical treatment) of upper endoscopy with ultrasound and possible  fine needle aspiration (EUS +/- FNA) were explained to patient/family in detail and patient wishes to proceed.  Endoscopic retrograde cholangiopancreatography for biliary decompression. Risks (up to and including bleeding, infection, perforation, pancreatitis that can be complicated by infected necrosis and death), benefits (removal of stones, alleviating blockage, decreasing risk of cholangitis or choledocholithiasis-related pancreatitis), and alternatives (watchful waiting, percutaneous transhepatic cholangiography) of ERCP were explained to patient/family in detail and patient elects to proceed.   Jordan Lang 02/08/2023, 9:28 AM

## 2023-02-09 ENCOUNTER — Other Ambulatory Visit (HOSPITAL_COMMUNITY): Payer: Self-pay | Admitting: Internal Medicine

## 2023-02-09 ENCOUNTER — Encounter: Payer: Self-pay | Admitting: Radiology

## 2023-02-09 ENCOUNTER — Other Ambulatory Visit: Payer: Self-pay | Admitting: Radiology

## 2023-02-09 DIAGNOSIS — K831 Obstruction of bile duct: Secondary | ICD-10-CM

## 2023-02-09 LAB — SURGICAL PATHOLOGY

## 2023-02-09 NOTE — H&P (Signed)
Chief Complaint: Biliary ductal dilation with jaundice. Request is for biliary drain placement.   Referring Physician(s): Lynann Bologna  Supervising Physician: Roanna Banning  Patient Status: [facility] - Out-pt To be admitted post procedure.   History of Present Illness: Jordan Lang is a 82 y.o. male outpatient. History of HTN. HLD. Known pancreatic head lesion. New onset of jaundice with 15 pound weight loss. Found to have biliary ductal dilation. Team is requesting a biliary drain placement.   Son [place] at bedside. Patient alert and laying in bed,calm. Denies any fevers, headache, chest pain, SOB, cough, abdominal pain, nausea, vomiting or bleeding. Return precautions and treatment recommendations and follow-up discussed with the patient and son. Both who are agreeable with the plan.    Past Medical History:  Diagnosis Date   COVID-19 06/2022   Glaucoma    Right eye   History of kidney stones    Hypercholesteremia    Hypertension    Renal disorder    kidney stones    Past Surgical History:  Procedure Laterality Date   CATARACT EXTRACTION Left 2022   CYSTOSCOPY WITH LITHOLAPAXY N/A 07/14/2022   Procedure: CYSTOSCOPY WITH LITHOLAPAXY;  Surgeon: Marcine Matar, MD;  Location: [facility];  Service: Urology;  Laterality: N/A;   CYSTOSCOPY WITH RETROGRADE PYELOGRAM, URETEROSCOPY AND STENT PLACEMENT Right 07/14/2022   Procedure: CYSTOSCOPY WITH RETROGRADE PYELOGRAM, URETEROSCOPY AND STENT PLACEMENT;  Surgeon: Marcine Matar, MD;  Location: [facility];  Service: Urology;  Laterality: Right;   HERNIA REPAIR     HOLMIUM LASER APPLICATION Right 07/14/2022   Procedure: HOLMIUM LASER APPLICATION;  Surgeon: Marcine Matar, MD;  Location: [facility];  Service: Urology;  Laterality: Right;    Allergies: Flomax [tamsulosin hcl]  Medications: Prior to Admission medications   Medication Sig Start Date End Date Taking?  Authorizing Provider  amLODipine (NORVASC) 5 MG tablet Take 1 tablet by mouth daily.    [provider]  calcium citrate (CALCITRATE - DOSED IN MG ELEMENTAL CALCIUM) 950 (200 Ca) MG tablet Take 200 mg of elemental calcium by mouth daily.    [provider]  Cholecalciferol (VITAMIN D3) 2000 units TABS Take 1 tablet by mouth daily.    [provider]  denosumab (PROLIA) 60 MG/ML SOSY injection Inject 60 mg into the skin every 6 (six) months.    [provider]  dorzolamide (TRUSOPT) 2 % ophthalmic solution 1 drop 2 (two) times daily.    [provider]  latanoprost (XALATAN) 0.005 % ophthalmic solution Place 1 drop into the right eye daily. 09/28/22   [provider]  Multiple Vitamins-Minerals (CENTRUM ADULTS PO) Take 1 tablet by mouth.    [provider]  polycarbophil (FIBERCON) 625 MG tablet Take 625 mg by mouth daily.    [provider]  PSYLLIUM HUSK PO Take 5 capsules by mouth daily.    [provider]  rosuvastatin (CRESTOR) 10 MG tablet Take 10 mg by mouth daily.    [provider]     Family History  Problem Relation Age of Onset   Alzheimer's disease Mother    Coronary artery disease Father 20   Prostate cancer Father    Coronary artery disease Brother 55    Social History   Socioeconomic History   Marital status: Married    Spouse name: Not on file   Number of children: 2   Years of education: Not on file   Highest education level: Not on file  Occupational History   Not on file  Tobacco Use   Smoking status: Never   Smokeless tobacco: Never  Vaping Use   Vaping status: Never Used  Substance and Sexual Activity   Alcohol use: Not Currently    Comment: Social   Drug use: Never   Sexual activity: Not on file  Other Topics Concern   Not on file  Social History Narrative   Exercises 5 to 6 days a week.  Walks 1-1/2 to 2 miles a day at least 3 to 4 days a week and then does  additional 3 days a week of light weights at the local gym.   Social Determinants of Health   Financial Resource Strain: Not on file  Food Insecurity: No Food Insecurity (06/06/2022)   Hunger Vital Sign    Worried About Running Out of Food in the Last Year: Never true    Ran Out of Food in the Last Year: Never true  Transportation Needs: Not on file  Physical Activity: Not on file  Stress: Not on file  Social Connections: Not on file     Review of Systems: A 12 point ROS discussed and pertinent positives are indicated in the HPI above.  All other systems are negative.  Review of Systems  Constitutional:  Negative for fever.  HENT:  Negative for congestion.   Respiratory:  Negative for cough and shortness of breath.   Cardiovascular:  Negative for chest pain.  Gastrointestinal:  Negative for abdominal pain.  Musculoskeletal:  Positive for arthralgias (neck pain) and back pain.  Neurological:  Negative for headaches.  Psychiatric/Behavioral:  Negative for behavioral problems and confusion.     Vital Signs: 140/76, HR 58 rr 17  Advance Care Plan: The advanced care plan/surrogate decision maker was discussed at the time of visit and documented in the medical record. Son Jordan Lang   Physical Exam Vitals and nursing note reviewed.  Constitutional:      Appearance: He is well-developed. He is ill-appearing.  HENT:     Head: Normocephalic.  Cardiovascular:     Rate and Rhythm: Regular rhythm. Bradycardia present.     Heart sounds: Normal heart sounds.  Pulmonary:     Effort: Pulmonary effort is normal.     Breath sounds: Normal breath sounds.  Musculoskeletal:        General: Normal range of motion.     Cervical back: Normal range of motion.  Skin:    General: Skin is warm and dry.     Coloration: Skin is jaundiced.  Neurological:     Mental Status: He is alert. Mental status is at baseline.  Psychiatric:        Mood and Affect: Mood normal.        Behavior: Behavior  normal.        Thought Content: Thought content normal.        Judgment: Judgment normal.     Imaging: DG C-Arm 1-60 Min-No Report  Result Date: 02/08/2023 Fluoroscopy was utilized by the requesting physician.  No radiographic interpretation.   CT ABDOMEN PELVIS WO CONTRAST  Result Date: 01/31/2023 CLINICAL DATA:  Jaundice, weight loss EXAM: CT ABDOMEN AND PELVIS WITHOUT CONTRAST TECHNIQUE: Multidetector CT imaging of the abdomen and pelvis was performed following the standard protocol without IV contrast. RADIATION DOSE REDUCTION: This exam was performed according to the departmental dose-optimization program which includes automated exposure control, adjustment of the mA and/or kV according to patient size and/or use of iterative reconstruction technique. COMPARISON:  MRI abdomen dated 12/26/2022. CT abdomen/pelvis dated 10/27/2020. FINDINGS: Lower chest: Lung bases are clear. Hepatobiliary: Unenhanced liver is grossly unremarkable. Gallbladder is unremarkable. Moderate intrahepatic ductal dilatation, new from recent MR. Dilated common duct, measuring 2.0 cm (series 5/image 17), new, previously 6 mm on MR. Given rapid change, a nonvisualized distal CBD stone is favored over a stricture or nonvisualized obstructing mass. ERCP is suggested. Pancreas: Known cystic lesion the pancreatic head/neck junction is poorly visualized on unenhanced CT, favored to be benign when correlating with prior MRI. Importantly, there is no pancreatic ductal dilatation or parenchymal atrophy to suggest a solid pancreatic mass. Spleen: Within normal limits. Adrenals/Urinary Tract: Adrenal glands are within normal limits. 3 mm nonobstructing right lower pole renal calculus (series 2/image 31). 2.9 cm simple left upper pole renal cyst (series 2/image 20), benign (Bosniak I). No follow-up is recommended. Bladder is within normal limits. Stomach/Bowel: Stomach is within normal limits. No evidence of bowel obstruction. Normal  appendix (series 2/image 5). No colonic wall thickening or inflammatory changes. Moderate colonic stool burden, suggesting mild constipation. Vascular/Lymphatic: No evidence of abdominal aortic aneurysm. Atherosclerotic calcifications of the abdominal aorta and branch vessels. No suspicious abdominopelvic lymphadenopathy. Reproductive: Prostatomegaly, suggesting BPH. Other: No abdominopelvic ascites. Musculoskeletal: Mild degenerative changes of the visualized thoracolumbar spine. IMPRESSION: Moderate intrahepatic and extrahepatic ductal dilatation, new from recent MR. Given rapid change, a nonvisualized distal CBD stone is favored over a stricture or nonvisualized obstructing mass. ERCP is suggested. Known cystic lesion the pancreatic head/neck junction is poorly visualized on unenhanced CT, favored to be benign when correlating with prior MRI. 3 mm nonobstructing right lower pole renal calculus. No hydronephrosis. Prostatomegaly, suggesting BPH. Electronically Signed   By: Charline Bills M.D.   On: 01/31/2023 12:54    Labs:  CBC: No results for input(s): "WBC", "HGB", "HCT", "PLT" in the last 8760 hours.  COAGS: No results for input(s): "INR", "APTT" in the last 8760 hours.  BMP: No results for input(s): "NA", "K", "CL", "CO2", "GLUCOSE", "BUN", "CALCIUM", "CREATININE", "GFRNONAA", "GFRAA" in the last 8760 hours.  Invalid input(s): "CMP"  LIVER FUNCTION TESTS: No results for input(s): "BILITOT", "AST", "ALT", "ALKPHOS", "PROT", "ALBUMIN" in the last 8760 hours.  Assessment and Plan:  82 y.o. male outpatient. History of HTN. HLD. Known pancreatic head lesion. New onset of jaundice with 15 pound weight loss. Found to have biliary ductal dilation. Team is requesting a biliary drain placement.  CT abd pelvis from 9.10.24 reads Moderate intrahepatic and extrahepatic ductal dilatation, new from recent MR. Given rapid change, a non visualized distal CBD stone is favored over a stricture or non  visualized obstructing mass. ERCP is suggested.  Labs pending. All medications are within acceptable parameters. No pertinent allergies. Patient has been NPO since midnight.   Risks and benefits of biliary drain discussed with the patient and his son including, but not limited to bleeding, infection which may lead to sepsis or even death and damage to adjacent structures.  This interventional procedure involves the use of X-rays and because of the nature of the planned procedure, it is possible that we will have prolonged use of X-ray fluoroscopy.  Potential radiation risks to you include (but are not limited to) the following: - A slightly elevated risk for cancer  several years later in life. This risk is typically less than 0.5% percent. This risk is low in comparison to the normal incidence of human cancer, which is 33% for women and 50% for men according to the American Cancer  Society. - Radiation induced injury can include skin redness, resembling a rash, tissue breakdown / ulcers and hair loss (which can be temporary or permanent).   The likelihood of either of these occurring depends on the difficulty of the procedure and whether you are sensitive to radiation due to previous procedures, disease, or genetic conditions.   IF your procedure requires a prolonged use of radiation, you will be notified and given written instructions for further action.  It is your responsibility to monitor the irradiated area for the 2 weeks following the procedure and to notify your physician if you are concerned that you have suffered a radiation induced injury.    All of the patient's  and his son's questions were answered. Patient and his son are both agreeable to proceed.  Consent signed and in chart.     Thank you for this interesting consult.  I greatly enjoyed meeting Jordan Lang and look forward to participating in their care.  A copy of this report was sent to the requesting provider on this  date.  Electronically Signed: Alene Mires, NP 02/10/2023, 1:32 PM   I spent a total of  30 Minutes  in face to face in clinical consultation, greater than 50% of which was counseling/coordinating care for biliary drain placement

## 2023-02-10 ENCOUNTER — Other Ambulatory Visit: Payer: Self-pay

## 2023-02-10 ENCOUNTER — Observation Stay (HOSPITAL_COMMUNITY)
Admission: RE | Admit: 2023-02-10 | Discharge: 2023-02-11 | Disposition: A | Discharge status: Home / Self Care | Payer: Medicare Other | Source: Ambulatory Visit | Attending: Interventional Radiology | Admitting: Interventional Radiology

## 2023-02-10 VITALS — BP 141/74 | HR 62 | Temp 98.5°F | Resp 18 | Ht 67.0 in | Wt 125.0 lb

## 2023-02-10 DIAGNOSIS — K8689 Other specified diseases of pancreas: Secondary | ICD-10-CM | POA: Diagnosis not present

## 2023-02-10 DIAGNOSIS — I10 Essential (primary) hypertension: Secondary | ICD-10-CM | POA: Diagnosis not present

## 2023-02-10 DIAGNOSIS — R17 Unspecified jaundice: Secondary | ICD-10-CM | POA: Diagnosis not present

## 2023-02-10 DIAGNOSIS — Z79899 Other long term (current) drug therapy: Secondary | ICD-10-CM | POA: Insufficient documentation

## 2023-02-10 DIAGNOSIS — Z8616 Personal history of COVID-19: Secondary | ICD-10-CM | POA: Diagnosis not present

## 2023-02-10 DIAGNOSIS — K319 Disease of stomach and duodenum, unspecified: Secondary | ICD-10-CM | POA: Diagnosis not present

## 2023-02-10 DIAGNOSIS — K831 Obstruction of bile duct: Principal | ICD-10-CM

## 2023-02-10 HISTORY — PX: IR BILIARY DRAIN PLACEMENT WITH CHOLANGIOGRAM: IMG6043

## 2023-02-10 HISTORY — PX: IR INT EXT BILIARY DRAIN WITH CHOLANGIOGRAM: IMG6044

## 2023-02-10 LAB — BASIC METABOLIC PANEL
Anion gap: 7 (ref 5–15)
BUN: 14 mg/dL (ref 8–23)
CO2: 26 mmol/L (ref 22–32)
Calcium: 8.2 mg/dL — ABNORMAL LOW (ref 8.9–10.3)
Chloride: 104 mmol/L (ref 98–111)
Creatinine, Ser: 0.78 mg/dL (ref 0.61–1.24)
GFR, Estimated: >60 mL/min (ref >60)
Glucose, Bld: 101 mg/dL — ABNORMAL HIGH (ref 70–99)
Potassium: 4.6 mmol/L (ref 3.5–5.1)
Sodium: 137 mmol/L (ref 135–145)

## 2023-02-10 LAB — PROTIME-INR
INR: 1 (ref 0.8–1.2)
INR: 1 (ref 0.8–1.2)
Prothrombin Time: 13.1 seconds (ref 11.4–15.2)
Prothrombin Time: 13.5 seconds (ref 11.4–15.2)

## 2023-02-10 LAB — CBC
HCT: 33 % — ABNORMAL LOW (ref 39.0–52.0)
Hemoglobin: 11.2 g/dL — ABNORMAL LOW (ref 13.0–17.0)
MCH: 32.7 pg (ref 26.0–34.0)
MCHC: 33.9 g/dL (ref 30.0–36.0)
MCV: 96.2 fL (ref 80.0–100.0)
Platelets: 283 10*3/uL (ref 150–400)
RBC: 3.43 MIL/uL — ABNORMAL LOW (ref 4.22–5.81)
RDW: 19.7 % — ABNORMAL HIGH (ref 11.5–15.5)
WBC: 5.7 10*3/uL (ref 4.0–10.5)
nRBC: 0 % (ref 0.0–0.2)

## 2023-02-10 MED ORDER — MIDAZOLAM HCL 2 MG/2ML IJ SOLN
INTRAMUSCULAR | Status: AC | PRN
  Administered 2023-02-10 (×2): .5 mg via INTRAVENOUS
  Administered 2023-02-10: 1 mg via INTRAVENOUS

## 2023-02-10 MED ORDER — LIDOCAINE HCL (PF) 1 % IJ SOLN
INTRAMUSCULAR | Status: AC
  Filled 2023-02-10: qty 30

## 2023-02-10 MED ORDER — IOHEXOL 300 MG/ML  SOLN
50.0000 mL | Freq: Once | INTRAMUSCULAR | Status: AC | PRN
  Administered 2023-02-10: 30 mL

## 2023-02-10 MED ORDER — FENTANYL CITRATE (PF) 100 MCG/2ML IJ SOLN
INTRAMUSCULAR | Status: AC | PRN
  Administered 2023-02-10 (×4): 25 ug via INTRAVENOUS
  Administered 2023-02-10: 50 ug via INTRAVENOUS
  Administered 2023-02-10: 25 ug via INTRAVENOUS

## 2023-02-10 MED ORDER — SODIUM CHLORIDE 0.9 % IV SOLN
2.0000 g | INTRAVENOUS | Status: AC
  Administered 2023-02-10: 2 g via INTRAVENOUS
  Filled 2023-02-10 (×2): qty 2

## 2023-02-10 MED ORDER — SODIUM CHLORIDE 0.9 % IV SOLN
INTRAVENOUS | Status: AC
  Filled 2023-02-10: qty 2

## 2023-02-10 MED ORDER — SODIUM CHLORIDE 0.9% FLUSH
5.0000 mL | Freq: Three times a day (TID) | INTRAVENOUS | Status: DC
  Administered 2023-02-10: 5 mL

## 2023-02-10 MED ORDER — SODIUM CHLORIDE 0.9 % IV SOLN: INTRAVENOUS | Status: DC

## 2023-02-10 MED ORDER — HYDROCODONE-ACETAMINOPHEN 5-325 MG PO TABS: 1.0000 | ORAL_TABLET | ORAL | Status: DC | PRN

## 2023-02-10 MED ORDER — FENTANYL CITRATE (PF) 100 MCG/2ML IJ SOLN
INTRAMUSCULAR | Status: AC
  Filled 2023-02-10: qty 2

## 2023-02-10 MED ORDER — SODIUM CHLORIDE 0.9 % IV SOLN
INTRAVENOUS | Status: AC | PRN
  Administered 2023-02-10: 2 g via INTRAVENOUS

## 2023-02-10 MED ORDER — MIDAZOLAM HCL 2 MG/2ML IJ SOLN
INTRAMUSCULAR | Status: AC
  Filled 2023-02-10: qty 2

## 2023-02-10 NOTE — Procedures (Signed)
  Procedure: Fluoro guided internal/external biliary drainage catheter placement  60f Preprocedure diagnosis: The encounter diagnosis was Biliary obstruction. Postprocedure diagnosis: same EBL:    minimal Complications:   none immediate  See full dictation in [employer].  Thora Lance MD Main # [phone] Pager  [phone] Mobile [phone]

## 2023-02-10 NOTE — Procedures (Addendum)
Duplicate/error

## 2023-02-11 ENCOUNTER — Encounter (HOSPITAL_COMMUNITY): Payer: Self-pay | Admitting: Gastroenterology

## 2023-02-11 DIAGNOSIS — K831 Obstruction of bile duct: Secondary | ICD-10-CM | POA: Diagnosis not present

## 2023-02-11 DIAGNOSIS — Z8616 Personal history of COVID-19: Secondary | ICD-10-CM | POA: Diagnosis not present

## 2023-02-11 DIAGNOSIS — Z79899 Other long term (current) drug therapy: Secondary | ICD-10-CM | POA: Diagnosis not present

## 2023-02-11 DIAGNOSIS — K8689 Other specified diseases of pancreas: Secondary | ICD-10-CM | POA: Diagnosis not present

## 2023-02-11 DIAGNOSIS — I10 Essential (primary) hypertension: Secondary | ICD-10-CM | POA: Diagnosis not present

## 2023-02-11 LAB — GLUCOSE, CAPILLARY: Glucose-Capillary: 76 mg/dL (ref 70–99)

## 2023-02-11 MED ORDER — AMLODIPINE BESYLATE 5 MG PO TABS: 5.0000 mg | ORAL_TABLET | Freq: Every day | ORAL | Status: DC

## 2023-02-11 NOTE — Progress Notes (Signed)
Patient discharged.  Tech removed PIV.

## 2023-02-11 NOTE — Discharge Summary (Signed)
Patient ID: Jordan Lang MRN: [id number] DOB/AGE: 08-11-1940 82 y.o.  Admit date: 02/10/2023 Discharge date: 02/11/2023  Supervising Physician: Marliss Coots  Patient Status: [facility] - In-pt  Admission Diagnoses: Obstructive jaundice  Discharge Diagnoses:  Active Problems:   * No active hospital problems. *   Discharged Condition: good  Hospital Course: 82 y/o M with history HTN, HLD, pancreatic head lesion who presented to IR as an outpatient for biliary drain placement per the request of Dr. Clancy Gourd due to new onset jaundice and weight loss with biliary ductal dilation. He was admitted for overnight observation.  Patient seen in his room this morning with son at bedside, he is anxious to get home. He says he feels fine and has no complaints. He does not have an appetite but that is not new today, he is able to tolerate some juice and crackers this morning. Denies abdominal pain, n/v, fevers, chills. No issues overnight per RN. Has been able to void several times without issue, no bowel movement today. BP elevated but patient does not wish to take anti-HTN medications in the hospital if not required, will take amlodipine when he returns home. No chest pain, dyspnea, headache, dizziness. Reviewed drain care with patient and son as well as follow up with IR. Patient and son feel comfortable with discharge today.   Plan: - Discharge home today - Take home amlodipine dose after discharge today per patient request - Follow up with GI (Dr. Lorenso Quarry) next week for repeat labs - Follow up with IR for routine drain exchange in 6-8 weeks (IR scheduler will call patient to set this up, IR contact info in AVS) - No routine drain flushes. Demonstrated flushing today with son and patient in case they are asked to flush the drain in the future. - Dressing changes every 2-3 days or earlier if soiled - Call IR with non-urgent questions or concerns. If develops fevers, chills, persistent abdominal  pain, n/v or change in mental status call IR or present to the ED. Return precautions reviewed with patient and son today.  Consults: None  Significant Diagnostic Studies: IR BILIARY DRAIN PLACEMENT WITH CHOLANGIOGRAM  Result Date: 02/10/2023 INDICATION: Marked biliary ductal dilatation, jaundice, duodenal mass. Endoscopic drainage could not be achieved. EXAM: PERCUTANEOUS TRANSHEPATIC CHOLANGIOGRAM PERCUTANEOUS INTERNAL/EXTERNAL BILIARY DRAIN CATHETER PLACEMENT MEDICATIONS: As antibiotic prophylaxis, cefoxitin 2 g was ordered pre-procedure and administered intravenously within one hour of incision. ANESTHESIA/SEDATION: Intravenous Fentanyl and Versed 2mg  were administered as conscious sedation during continuous monitoring of the patient's level of consciousness and physiological / cardiorespiratory status by the radiology RN, with a total moderate sedation time of 45 minutes. PROCEDURE: Informed written consent was obtained from the patient after a thorough discussion of the procedural risks, benefits and alternatives. All questions were addressed. Maximal Sterile Barrier Technique was utilized including caps, mask, sterile gowns, sterile gloves, sterile drape, hand hygiene and skin antiseptic. A timeout was performed prior to the initiation of the procedure. Dilated intrahepatic biliary tree was identified on ultrasound and a peripheral left lobe duct was selected for approach. Skin and subcutaneous tissues down to the liver capsule infiltrated with 1% lidocaine. Under real-time ultrasound guidance, a 21 gauge micropuncture needle was advanced into the duct. Small contrast injection under fluoroscopy confirmed appropriate positioning, and opacified other dilated left lobe ducts. A 018 guidewire would not advance centrally. Therefore the micropuncture was redirected under fluoroscopy into an opacified peripheral dilated duct visible on fluoroscopy. A 018 guidewire advanced centrally. A 4 [place]  transitional dilator was placed. Contrast injection demonstrated dilated intrahepatic biliary tree and proximal CBD. A 5 French micropuncture dilator was placed. Contrast injection for percutaneous transhepatic cholangiogram was performed. Catheter exchanged over an angled Glidewire for a 5 [place] Kumpe catheter, advanced to the distal CBD for additional cholangiographic images. The Kumpe the was then advanced into the distal duodenum and exchanged over an Amplatz wire for vascular dilator which facilitated placement of a 8French internal/external biliary drain catheter, placed with sideholes spanning the distal CBD obstruction, and holes in the distal duodenum. Approximately 50 mL bile was evacuated from the biliary tree. confirmatory contrast injection under fluoroscopy performed. The catheter was then flushed with saline and secured externally with 0 Prolene suture and StatLock, placed to external drain bag to maximize biliary decompression. The patient tolerated the procedure well. FLUOROSCOPY TIME:  Radiation Exposure Index (as provided by the fluoroscopic device): Or are MGy air Kerma COMPLICATIONS: None immediate. FINDINGS: The percutaneous transhepatic cholangiogram confirms dilated intrahepatic biliary tree and CBD. 8 [place] internal/external biliary drainage catheter placed as above. IMPRESSION: 1. Marked  intrahepatic and extrahepatic biliary ductal dilatation. 2. Technically successful internal/external biliary drain catheter placement. Electronically Signed   By: Corlis Leak M.D.   On: 02/10/2023 16:57   DG C-Arm 1-60 Min-No Report  Result Date: 02/08/2023 Fluoroscopy was utilized by the requesting physician.  No radiographic interpretation.   CT ABDOMEN PELVIS WO CONTRAST  Result Date: 01/31/2023 CLINICAL DATA:  Jaundice, weight loss EXAM: CT ABDOMEN AND PELVIS WITHOUT CONTRAST TECHNIQUE: Multidetector CT imaging of the abdomen and pelvis was performed following the standard protocol without IV  contrast. RADIATION DOSE REDUCTION: This exam was performed according to the departmental dose-optimization program which includes automated exposure control, adjustment of the mA and/or kV according to patient size and/or use of iterative reconstruction technique. COMPARISON:  MRI abdomen dated 12/26/2022. CT abdomen/pelvis dated 10/27/2020. FINDINGS: Lower chest: Lung bases are clear. Hepatobiliary: Unenhanced liver is grossly unremarkable. Gallbladder is unremarkable. Moderate intrahepatic ductal dilatation, new from recent MR. Dilated common duct, measuring 2.0 cm (series 5/image 17), new, previously 6 mm on MR. Given rapid change, a nonvisualized distal CBD stone is favored over a stricture or nonvisualized obstructing mass. ERCP is suggested. Pancreas: Known cystic lesion the pancreatic head/neck junction is poorly visualized on unenhanced CT, favored to be benign when correlating with prior MRI. Importantly, there is no pancreatic ductal dilatation or parenchymal atrophy to suggest a solid pancreatic mass. Spleen: Within normal limits. Adrenals/Urinary Tract: Adrenal glands are within normal limits. 3 mm nonobstructing right lower pole renal calculus (series 2/image 31). 2.9 cm simple left upper pole renal cyst (series 2/image 20), benign (Bosniak I). No follow-up is recommended. Bladder is within normal limits. Stomach/Bowel: Stomach is within normal limits. No evidence of bowel obstruction. Normal appendix (series 2/image 5). No colonic wall thickening or inflammatory changes. Moderate colonic stool burden, suggesting mild constipation. Vascular/Lymphatic: No evidence of abdominal aortic aneurysm. Atherosclerotic calcifications of the abdominal aorta and branch vessels. No suspicious abdominopelvic lymphadenopathy. Reproductive: Prostatomegaly, suggesting BPH. Other: No abdominopelvic ascites. Musculoskeletal: Mild degenerative changes of the visualized thoracolumbar spine. IMPRESSION: Moderate intrahepatic  and extrahepatic ductal dilatation, new from recent MR. Given rapid change, a nonvisualized distal CBD stone is favored over a stricture or nonvisualized obstructing mass. ERCP is suggested. Known cystic lesion the pancreatic head/neck junction is poorly visualized on unenhanced CT, favored to be benign when correlating with prior MRI. 3 mm nonobstructing right lower pole renal calculus. No hydronephrosis. Prostatomegaly, suggesting BPH.  Electronically Signed   By: Charline Bills M.D.   On: 01/31/2023 12:54    Treatments: IV hydration  Discharge Exam: Blood pressure (!) 141/74, pulse 62, temperature 98.5 F (36.9 C), temperature source Oral, resp. rate 18, height 5\' 7"  (1.702 m), weight 125 lb (56.7 kg), SpO2 98%. Physical Exam Vitals and nursing note reviewed.  Constitutional:      General: He is not in acute distress.    Appearance: He is ill-appearing.  HENT:     Head: Normocephalic.  Eyes:     General: Scleral icterus present.  Cardiovascular:     Rate and Rhythm: Normal rate and regular rhythm.  Pulmonary:     Effort: Pulmonary effort is normal.     Breath sounds: Normal breath sounds.  Abdominal:     General: There is no distension.     Palpations: Abdomen is soft.     Tenderness: There is no abdominal tenderness.     Comments: (+) biliary drain to gravity with ~400 cc dark green bilious output. Drain flushed easily with 10 cc NS. Insertion site clean, dry, dressed appropriately.   Skin:    General: Skin is warm and dry.     Coloration: Skin is jaundiced.  Neurological:     Mental Status: He is alert and oriented to person, place, and time.  Psychiatric:        Mood and Affect: Mood normal.        Behavior: Behavior normal.        Thought Content: Thought content normal.        Judgment: Judgment normal.     Disposition:    Allergies as of 02/11/2023       Reactions   Flomax [tamsulosin Hcl] Other (See Comments)   Dizziness        Medication List     TAKE  these medications    amLODipine 5 MG tablet Commonly known as: NORVASC Take 1 tablet by mouth daily.   calcium citrate 950 (200 Ca) MG tablet Commonly known as: CALCITRATE - dosed in mg elemental calcium Take 200 mg of elemental calcium by mouth daily.   CENTRUM ADULTS PO Take 1 tablet by mouth.   denosumab 60 MG/ML Sosy injection Commonly known as: PROLIA Inject 60 mg into the skin every 6 (six) months.   DORZOLAMIDE HCL-TIMOLOL MAL OP Place 1 drop into both eyes daily. 22.3-6.8 mg   latanoprost 0.005 % ophthalmic solution Commonly known as: XALATAN Place 1 drop into the right eye daily.   polycarbophil 625 MG tablet Commonly known as: FIBERCON Take 625 mg by mouth daily.   PSYLLIUM HUSK PO Take 5 capsules by mouth daily.   rosuvastatin 10 MG tablet Commonly known as: CRESTOR Take 10 mg by mouth daily.   Vitamin D3 50 MCG (2000 UT) Tabs Take 1 tablet by mouth daily.          Electronically Signed: Villa Herb, PA-C 02/11/2023, 10:49 AM   I have spent Greater Than 30 Minutes discharging Arlyn Leak.

## 2023-02-13 ENCOUNTER — Encounter (HOSPITAL_COMMUNITY): Payer: Self-pay

## 2023-02-15 DIAGNOSIS — G311 Senile degeneration of brain, not elsewhere classified: Secondary | ICD-10-CM | POA: Diagnosis not present

## 2023-02-15 DIAGNOSIS — K862 Cyst of pancreas: Secondary | ICD-10-CM | POA: Diagnosis not present

## 2023-02-15 DIAGNOSIS — Z66 Do not resuscitate: Secondary | ICD-10-CM | POA: Diagnosis not present

## 2023-02-15 DIAGNOSIS — E785 Hyperlipidemia, unspecified: Secondary | ICD-10-CM | POA: Diagnosis not present

## 2023-02-15 DIAGNOSIS — Z4803 Encounter for change or removal of drains: Secondary | ICD-10-CM | POA: Diagnosis not present

## 2023-02-15 DIAGNOSIS — R17 Unspecified jaundice: Secondary | ICD-10-CM | POA: Diagnosis not present

## 2023-02-15 DIAGNOSIS — N400 Enlarged prostate without lower urinary tract symptoms: Secondary | ICD-10-CM | POA: Diagnosis not present

## 2023-02-15 DIAGNOSIS — Z978 Presence of other specified devices: Secondary | ICD-10-CM | POA: Diagnosis not present

## 2023-02-15 DIAGNOSIS — N200 Calculus of kidney: Secondary | ICD-10-CM | POA: Diagnosis not present

## 2023-02-15 DIAGNOSIS — K838 Other specified diseases of biliary tract: Secondary | ICD-10-CM | POA: Diagnosis not present

## 2023-02-15 DIAGNOSIS — Z515 Encounter for palliative care: Secondary | ICD-10-CM | POA: Diagnosis not present

## 2023-02-15 DIAGNOSIS — F02A4 Dementia in other diseases classified elsewhere, mild, with anxiety: Secondary | ICD-10-CM | POA: Diagnosis not present

## 2023-02-15 DIAGNOSIS — I10 Essential (primary) hypertension: Secondary | ICD-10-CM | POA: Diagnosis not present

## 2023-02-15 DIAGNOSIS — H401110 Primary open-angle glaucoma, right eye, stage unspecified: Secondary | ICD-10-CM | POA: Diagnosis not present

## 2023-02-16 DIAGNOSIS — G311 Senile degeneration of brain, not elsewhere classified: Secondary | ICD-10-CM | POA: Diagnosis not present

## 2023-02-16 DIAGNOSIS — E785 Hyperlipidemia, unspecified: Secondary | ICD-10-CM | POA: Diagnosis not present

## 2023-02-16 DIAGNOSIS — H401110 Primary open-angle glaucoma, right eye, stage unspecified: Secondary | ICD-10-CM | POA: Diagnosis not present

## 2023-02-16 DIAGNOSIS — I10 Essential (primary) hypertension: Secondary | ICD-10-CM | POA: Diagnosis not present

## 2023-02-16 DIAGNOSIS — N200 Calculus of kidney: Secondary | ICD-10-CM | POA: Diagnosis not present

## 2023-02-16 DIAGNOSIS — F02A4 Dementia in other diseases classified elsewhere, mild, with anxiety: Secondary | ICD-10-CM | POA: Diagnosis not present

## 2023-02-17 DIAGNOSIS — H401110 Primary open-angle glaucoma, right eye, stage unspecified: Secondary | ICD-10-CM | POA: Diagnosis not present

## 2023-02-17 DIAGNOSIS — F02A4 Dementia in other diseases classified elsewhere, mild, with anxiety: Secondary | ICD-10-CM | POA: Diagnosis not present

## 2023-02-17 DIAGNOSIS — E785 Hyperlipidemia, unspecified: Secondary | ICD-10-CM | POA: Diagnosis not present

## 2023-02-17 DIAGNOSIS — I10 Essential (primary) hypertension: Secondary | ICD-10-CM | POA: Diagnosis not present

## 2023-02-17 DIAGNOSIS — N200 Calculus of kidney: Secondary | ICD-10-CM | POA: Diagnosis not present

## 2023-02-17 DIAGNOSIS — G311 Senile degeneration of brain, not elsewhere classified: Secondary | ICD-10-CM | POA: Diagnosis not present

## 2023-02-18 DIAGNOSIS — G311 Senile degeneration of brain, not elsewhere classified: Secondary | ICD-10-CM | POA: Diagnosis not present

## 2023-02-18 DIAGNOSIS — F02A4 Dementia in other diseases classified elsewhere, mild, with anxiety: Secondary | ICD-10-CM | POA: Diagnosis not present

## 2023-02-18 DIAGNOSIS — E785 Hyperlipidemia, unspecified: Secondary | ICD-10-CM | POA: Diagnosis not present

## 2023-02-18 DIAGNOSIS — H401110 Primary open-angle glaucoma, right eye, stage unspecified: Secondary | ICD-10-CM | POA: Diagnosis not present

## 2023-02-18 DIAGNOSIS — I10 Essential (primary) hypertension: Secondary | ICD-10-CM | POA: Diagnosis not present

## 2023-02-18 DIAGNOSIS — N200 Calculus of kidney: Secondary | ICD-10-CM | POA: Diagnosis not present

## 2023-02-19 DIAGNOSIS — G311 Senile degeneration of brain, not elsewhere classified: Secondary | ICD-10-CM | POA: Diagnosis not present

## 2023-02-19 DIAGNOSIS — N200 Calculus of kidney: Secondary | ICD-10-CM | POA: Diagnosis not present

## 2023-02-19 DIAGNOSIS — F02A4 Dementia in other diseases classified elsewhere, mild, with anxiety: Secondary | ICD-10-CM | POA: Diagnosis not present

## 2023-02-19 DIAGNOSIS — H401110 Primary open-angle glaucoma, right eye, stage unspecified: Secondary | ICD-10-CM | POA: Diagnosis not present

## 2023-02-19 DIAGNOSIS — I10 Essential (primary) hypertension: Secondary | ICD-10-CM | POA: Diagnosis not present

## 2023-02-19 DIAGNOSIS — E785 Hyperlipidemia, unspecified: Secondary | ICD-10-CM | POA: Diagnosis not present

## 2023-02-20 DIAGNOSIS — G311 Senile degeneration of brain, not elsewhere classified: Secondary | ICD-10-CM | POA: Diagnosis not present

## 2023-02-20 DIAGNOSIS — E785 Hyperlipidemia, unspecified: Secondary | ICD-10-CM | POA: Diagnosis not present

## 2023-02-20 DIAGNOSIS — F02A4 Dementia in other diseases classified elsewhere, mild, with anxiety: Secondary | ICD-10-CM | POA: Diagnosis not present

## 2023-02-20 DIAGNOSIS — H401110 Primary open-angle glaucoma, right eye, stage unspecified: Secondary | ICD-10-CM | POA: Diagnosis not present

## 2023-02-20 DIAGNOSIS — N200 Calculus of kidney: Secondary | ICD-10-CM | POA: Diagnosis not present

## 2023-02-20 DIAGNOSIS — I10 Essential (primary) hypertension: Secondary | ICD-10-CM | POA: Diagnosis not present

## 2023-02-21 DIAGNOSIS — I10 Essential (primary) hypertension: Secondary | ICD-10-CM | POA: Diagnosis not present

## 2023-02-21 DIAGNOSIS — R17 Unspecified jaundice: Secondary | ICD-10-CM | POA: Diagnosis not present

## 2023-02-21 DIAGNOSIS — Z515 Encounter for palliative care: Secondary | ICD-10-CM | POA: Diagnosis not present

## 2023-02-21 DIAGNOSIS — F02A4 Dementia in other diseases classified elsewhere, mild, with anxiety: Secondary | ICD-10-CM | POA: Diagnosis not present

## 2023-02-21 DIAGNOSIS — Z978 Presence of other specified devices: Secondary | ICD-10-CM | POA: Diagnosis not present

## 2023-02-21 DIAGNOSIS — Z4803 Encounter for change or removal of drains: Secondary | ICD-10-CM | POA: Diagnosis not present

## 2023-02-21 DIAGNOSIS — N200 Calculus of kidney: Secondary | ICD-10-CM | POA: Diagnosis not present

## 2023-02-21 DIAGNOSIS — E785 Hyperlipidemia, unspecified: Secondary | ICD-10-CM | POA: Diagnosis not present

## 2023-02-21 DIAGNOSIS — K838 Other specified diseases of biliary tract: Secondary | ICD-10-CM | POA: Diagnosis not present

## 2023-02-21 DIAGNOSIS — H401110 Primary open-angle glaucoma, right eye, stage unspecified: Secondary | ICD-10-CM | POA: Diagnosis not present

## 2023-02-21 DIAGNOSIS — Z66 Do not resuscitate: Secondary | ICD-10-CM | POA: Diagnosis not present

## 2023-02-21 DIAGNOSIS — K862 Cyst of pancreas: Secondary | ICD-10-CM | POA: Diagnosis not present

## 2023-02-21 DIAGNOSIS — G311 Senile degeneration of brain, not elsewhere classified: Secondary | ICD-10-CM | POA: Diagnosis not present

## 2023-02-21 DIAGNOSIS — N400 Enlarged prostate without lower urinary tract symptoms: Secondary | ICD-10-CM | POA: Diagnosis not present

## 2023-02-22 DIAGNOSIS — E785 Hyperlipidemia, unspecified: Secondary | ICD-10-CM | POA: Diagnosis not present

## 2023-02-22 DIAGNOSIS — H401110 Primary open-angle glaucoma, right eye, stage unspecified: Secondary | ICD-10-CM | POA: Diagnosis not present

## 2023-02-22 DIAGNOSIS — N200 Calculus of kidney: Secondary | ICD-10-CM | POA: Diagnosis not present

## 2023-02-22 DIAGNOSIS — G311 Senile degeneration of brain, not elsewhere classified: Secondary | ICD-10-CM | POA: Diagnosis not present

## 2023-02-22 DIAGNOSIS — F02A4 Dementia in other diseases classified elsewhere, mild, with anxiety: Secondary | ICD-10-CM | POA: Diagnosis not present

## 2023-02-22 DIAGNOSIS — I10 Essential (primary) hypertension: Secondary | ICD-10-CM | POA: Diagnosis not present

## 2023-02-23 DIAGNOSIS — N200 Calculus of kidney: Secondary | ICD-10-CM | POA: Diagnosis not present

## 2023-02-23 DIAGNOSIS — H401110 Primary open-angle glaucoma, right eye, stage unspecified: Secondary | ICD-10-CM | POA: Diagnosis not present

## 2023-02-23 DIAGNOSIS — I10 Essential (primary) hypertension: Secondary | ICD-10-CM | POA: Diagnosis not present

## 2023-02-23 DIAGNOSIS — F02A4 Dementia in other diseases classified elsewhere, mild, with anxiety: Secondary | ICD-10-CM | POA: Diagnosis not present

## 2023-02-23 DIAGNOSIS — E785 Hyperlipidemia, unspecified: Secondary | ICD-10-CM | POA: Diagnosis not present

## 2023-02-23 DIAGNOSIS — G311 Senile degeneration of brain, not elsewhere classified: Secondary | ICD-10-CM | POA: Diagnosis not present

## 2023-03-24 DEATH — deceased
